# Patient Record
Sex: Male | Born: 1970 | Race: Black or African American | Hispanic: No | Marital: Single | Smoking: Never smoker
Health system: Southern US, Community
[De-identification: ages and names within clinical notes are randomized; demographics above are authoritative.]

## PROBLEM LIST (undated history)

## (undated) DIAGNOSIS — K8020 Calculus of gallbladder without cholecystitis without obstruction: Secondary | ICD-10-CM

## (undated) DIAGNOSIS — I10 Essential (primary) hypertension: Secondary | ICD-10-CM

## (undated) DIAGNOSIS — K219 Gastro-esophageal reflux disease without esophagitis: Secondary | ICD-10-CM

---

## 2009-03-15 ENCOUNTER — Emergency Department: Payer: Self-pay | Admitting: Emergency Medicine

## 2011-06-11 ENCOUNTER — Emergency Department: Payer: Self-pay | Admitting: Emergency Medicine

## 2011-07-30 ENCOUNTER — Emergency Department: Payer: Self-pay | Admitting: Emergency Medicine

## 2014-03-13 ENCOUNTER — Emergency Department: Payer: Self-pay | Admitting: General Practice

## 2014-10-03 ENCOUNTER — Other Ambulatory Visit: Payer: Self-pay

## 2014-10-03 ENCOUNTER — Encounter: Payer: Self-pay | Admitting: Medical Oncology

## 2014-10-03 ENCOUNTER — Emergency Department
Admission: EM | Admit: 2014-10-03 | Discharge: 2014-10-03 | Disposition: A | Discharge status: Home / Self Care | Payer: No Typology Code available for payment source | Attending: Emergency Medicine | Admitting: Emergency Medicine

## 2014-10-03 ENCOUNTER — Emergency Department: Payer: No Typology Code available for payment source

## 2014-10-03 DIAGNOSIS — R1011 Right upper quadrant pain: Secondary | ICD-10-CM

## 2014-10-03 DIAGNOSIS — K8020 Calculus of gallbladder without cholecystitis without obstruction: Secondary | ICD-10-CM | POA: Diagnosis not present

## 2014-10-03 LAB — CBC WITH DIFFERENTIAL/PLATELET
Basophils Absolute: 0 10*3/uL (ref 0–0.1)
Basophils Relative: 0 %
Eosinophils Absolute: 0 10*3/uL (ref 0–0.7)
HCT: 45.5 % (ref 40.0–52.0)
Hemoglobin: 14.5 g/dL (ref 13.0–18.0)
LYMPHS ABS: 2 10*3/uL (ref 1.0–3.6)
Lymphocytes Relative: 15 %
MCH: 26.4 pg (ref 26.0–34.0)
MCHC: 31.8 g/dL — AB (ref 32.0–36.0)
MCV: 83.2 fL (ref 80.0–100.0)
Monocytes Absolute: 0.5 10*3/uL (ref 0.2–1.0)
Neutro Abs: 10.7 10*3/uL — ABNORMAL HIGH (ref 1.4–6.5)
Neutrophils Relative %: 81 %
PLATELETS: 509 10*3/uL — AB (ref 150–440)
RBC: 5.47 MIL/uL (ref 4.40–5.90)
RDW: 16.2 % — AB (ref 11.5–14.5)
WBC: 13.4 10*3/uL — ABNORMAL HIGH (ref 3.8–10.6)

## 2014-10-03 LAB — COMPREHENSIVE METABOLIC PANEL
ALBUMIN: 4.1 g/dL (ref 3.5–5.0)
ALT: 29 U/L (ref 17–63)
ANION GAP: 9 (ref 5–15)
AST: 19 U/L (ref 15–41)
Alkaline Phosphatase: 68 U/L (ref 38–126)
BUN: 17 mg/dL (ref 6–20)
CO2: 27 mmol/L (ref 22–32)
Calcium: 10 mg/dL (ref 8.9–10.3)
Chloride: 104 mmol/L (ref 101–111)
Creatinine, Ser: 1.51 mg/dL — ABNORMAL HIGH (ref 0.61–1.24)
GFR calc Af Amer: >60 mL/min (ref >60)
GFR, EST NON AFRICAN AMERICAN: 55 mL/min — AB (ref >60)
GLUCOSE: 160 mg/dL — AB (ref 65–99)
Potassium: 3.7 mmol/L (ref 3.5–5.1)
SODIUM: 140 mmol/L (ref 135–145)
Total Bilirubin: 0.3 mg/dL (ref 0.3–1.2)
Total Protein: 8.2 g/dL — ABNORMAL HIGH (ref 6.5–8.1)

## 2014-10-03 LAB — LIPASE, BLOOD: LIPASE: 24 U/L (ref 22–51)

## 2014-10-03 MED ORDER — HYDROMORPHONE HCL 1 MG/ML IJ SOLN
1.0000 mg | INTRAMUSCULAR | Status: AC
  Administered 2014-10-03: 1 mg via INTRAVENOUS

## 2014-10-03 MED ORDER — ONDANSETRON HCL 4 MG/2ML IJ SOLN
INTRAMUSCULAR | Status: AC
  Administered 2014-10-03: 4 mg via INTRAVENOUS
  Filled 2014-10-03: qty 2

## 2014-10-03 MED ORDER — ONDANSETRON HCL 4 MG PO TABS: 4.0000 mg | ORAL_TABLET | ORAL | Status: DC | PRN

## 2014-10-03 MED ORDER — HYDROMORPHONE HCL 1 MG/ML IJ SOLN
INTRAMUSCULAR | Status: AC
  Administered 2014-10-03: 1 mg via INTRAVENOUS
  Filled 2014-10-03: qty 1

## 2014-10-03 MED ORDER — OXYCODONE-ACETAMINOPHEN 5-325 MG PO TABS: 1.0000 | ORAL_TABLET | ORAL | Status: AC | PRN

## 2014-10-03 MED ORDER — HYDROMORPHONE HCL 1 MG/ML IJ SOLN
1.0000 mg | Freq: Once | INTRAMUSCULAR | Status: AC
  Administered 2014-10-03: 1 mg via INTRAVENOUS

## 2014-10-03 MED ORDER — SODIUM CHLORIDE 0.9 % IV BOLUS (SEPSIS)
1000.0000 mL | Freq: Once | INTRAVENOUS | Status: AC
  Administered 2014-10-03: 1000 mL via INTRAVENOUS

## 2014-10-03 MED ORDER — ONDANSETRON HCL 4 MG/2ML IJ SOLN
4.0000 mg | Freq: Once | INTRAMUSCULAR | Status: AC
  Administered 2014-10-03: 4 mg via INTRAVENOUS

## 2014-10-03 NOTE — ED Provider Notes (Signed)
[facility] Emergency Department Provider Note    ____________________________________________  Time seen: 8:40 AM  I have reviewed the triage vital signs and the nursing notes.   HISTORY  Chief Complaint Abdominal Pain      HPI Jay Mccormick is a 44 y.o. male who is in his usual state of health prior to today. This morning he woke up at 4:00 AM, and had used the bathroom. He had 2 loose bowel movements. He also reported some mild right upper quadrant pain at that time. He went back to sleep, and woke up at 6 AM with constant worsening severe right upper quadrant abdominal pain. The pain is crampy, nonradiating, severe, and has no aggravating or alleviating symptoms. He has been eating normally prior to today, but has not eaten or drank anything today. He denies any medical history and takes no medicines.  History reviewed. No pertinent past medical history.  There are no active problems to display for this patient.   History reviewed. No pertinent past surgical history.  Current Outpatient Rx  Name  Route  Sig  Dispense  Refill  . ondansetron (ZOFRAN) 4 MG tablet   Oral   Take 1 tablet (4 mg total) by mouth every 4 (four) hours as needed for nausea or vomiting.   30 tablet   1   . oxyCODONE-acetaminophen (ROXICET) 5-325 MG per tablet   Oral   Take 1 tablet by mouth every 4 (four) hours as needed for severe pain.   20 tablet   0     Allergies Review of patient's allergies indicates no known allergies.  History reviewed. No pertinent family history.  Social History History  Substance Use Topics  . Smoking status: Never Smoker   . Smokeless tobacco: Not on file  . Alcohol Use: Yes    Review of Systems  Constitutional: Negative for fever. Eyes: Negative for visual changes. ENT: Negative for sore throat. Cardiovascular: Negative for chest pain. Respiratory: Negative for shortness of breath. Gastrointestinal: Abdominal pain, nausea,  diarrhea. No vomiting Genitourinary: Negative for dysuria. Musculoskeletal: Negative for back pain. Skin: Negative for rash. Neurological: Negative for headaches, focal weakness or numbness.   10-point ROS otherwise negative.  ____________________________________________   PHYSICAL EXAM:  VITAL SIGNS: ED Triage Vitals  Enc Vitals Group     BP 10/03/14 0822 148/91 mmHg     Pulse Rate 10/03/14 0822 64     Resp 10/03/14 0822 16     Temp 10/03/14 0822 98 F (36.7 C)     Temp Source 10/03/14 0822 Oral     SpO2 10/03/14 0822 95 %     Weight 10/03/14 0822 290 lb (131.543 kg)     Height 10/03/14 0822 5\' 11"  (1.803 m)     Head Cir --      Peak Flow --      Pain Score 10/03/14 0824 10     Pain Loc --      Pain Edu? --      Excl. in GC? --      Constitutional: Alert and oriented. Well appearing and in no distress. Eyes: Conjunctivae are normal. PERRL. Normal extraocular movements. ENT   Head: Normocephalic and atraumatic.   Nose: No congestion/rhinnorhea.   Mouth/Throat: Mucous membranes are dry.   Neck: No stridor. Hematological/Lymphatic/Immunilogical: No cervical lymphadenopathy. Cardiovascular: Normal rate, regular rhythm. Normal and symmetric distal pulses are present in all extremities. No murmurs, rubs, or gallops. Respiratory: Normal respiratory effort without tachypnea nor retractions. Breath  sounds are clear and equal bilaterally. No wheezes/rales/rhonchi. Gastrointestinal: Soft, with right upper quadrant tenderness. The chest wall is nontender.. No distention. No abdominal bruits. There is no CVA tenderness. No rebound, rigidity, or guarding. Genitourinary: Deferred Musculoskeletal: Nontender with normal range of motion in all extremities. No joint effusions.  No lower extremity tenderness nor edema. Neurologic:  Normal speech and language. No gross focal neurologic deficits are appreciated. Speech is normal. No gait instability. Skin:  Skin is warm, dry  and intact. No rash noted. Psychiatric: Mood and affect are normal. Speech and behavior are normal. Patient exhibits appropriate insight and judgment.  ____________________________________________   EKG  Normal sinus rhythm with a rate of 62. There are inferior T wave inversions in 3 and aVF. There is a nonspecific interventricular conduction delay. Normal axis, intervals.  ____________________________________________    RADIOLOGY  RUQ [place] significant significant for a gallstone impacted in the gallbladder neck. There is no pericholecystic fluid, gallbladder wall thickening, or other signs of cholecystitis.  ____________________________________________   PROCEDURES  Procedure(s) performed: None  Critical Care performed: No  ____________________________________________   INITIAL IMPRESSION / ASSESSMENT AND PLAN / ED COURSE  Pertinent labs & imaging results that were available during my care of the patient were reviewed by me and considered in my medical decision making (see chart for details).  The patient's presentation is concerning for cholecystitis. He is afebrile and has normal vital signs, so we will give him IV fluids, IV Dilaudid and Zofran, check labs and a right upper quadrant ultrasound. No suspicion of aortic aneurysm, pneumothorax, ACS, PE, pneumonia, sepsis, appendicitis.  Results for orders placed or performed during the hospital encounter of 10/03/14  Comprehensive metabolic panel  Result Value Ref Range   Sodium 140 135 - 145 mmol/L   Potassium 3.7 3.5 - 5.1 mmol/L   Chloride 104 101 - 111 mmol/L   CO2 27 22 - 32 mmol/L   Glucose, Bld 160 (H) 65 - 99 mg/dL   BUN 17 6 - 20 mg/dL   Creatinine, Ser 1.61 (H) 0.61 - 1.24 mg/dL   Calcium 09.6 8.9 - 04.5 mg/dL   Total Protein 8.2 (H) 6.5 - 8.1 g/dL   Albumin 4.1 3.5 - 5.0 g/dL   AST 19 15 - 41 U/L   ALT 29 17 - 63 U/L   Alkaline Phosphatase 68 38 - 126 U/L   Total Bilirubin 0.3 0.3 - 1.2 mg/dL   GFR calc  non Af Amer 55 (L) >60 mL/min   GFR calc Af Amer >60 >60 mL/min   Anion gap 9 5 - 15  Lipase, blood  Result Value Ref Range   Lipase 24 22 - 51 U/L  CBC with Differential  Result Value Ref Range   WBC 13.4 (H) 3.8 - 10.6 K/uL   RBC 5.47 4.40 - 5.90 MIL/uL   Hemoglobin 14.5 13.0 - 18.0 g/dL   HCT 40.9 81.1 - 91.4 %   MCV 83.2 80.0 - 100.0 fL   MCH 26.4 26.0 - 34.0 pg   MCHC 31.8 (L) 32.0 - 36.0 g/dL   RDW 78.2 (H) 95.6 - 21.3 %   Platelets 509 (H) 150 - 440 K/uL   Neutrophils Relative % 81% %   Neutro Abs 10.7 (H) 1.4 - 6.5 K/uL   Lymphocytes Relative 15% %   Lymphs Abs 2.0 1.0 - 3.6 K/uL   Monocytes Relative 4% %   Monocytes Absolute 0.5 0.2 - 1.0 K/uL   Eosinophils Relative 0% %  Eosinophils Absolute 0.0 0 - 0.7 K/uL   Basophils Relative 0% %   Basophils Absolute 0.0 0 - 0.1 K/uL   ----------------------------------------- 2:14 PM on 10/03/2014 -----------------------------------------  The patient's ultrasound was reviewed at 11:20 AM. It does show an impacted stone in gallbladder neck, and I discussed this with Dr. Anda Kraft at 11:30 AM. Without evidence of biliary obstruction or cholecystitis, was recommended that the patient follow-up in clinic this week if he is able to tolerate by mouth. Patient has been able to take fluids and his pain is controlled at this time. He is afebrile vital signs are within normal limits are otherwise unremarkable, as we will discharge him home with prescriptions for Percocet and Zofran, and he'll follow up with surgery in clinic this week. ____________________________________________   FINAL CLINICAL IMPRESSION(S) / ED DIAGNOSES  Final diagnoses:  Calculus of gallbladder without cholecystitis without obstruction     Sharman Cheek, MD 10/03/14 [phone]

## 2014-10-03 NOTE — ED Notes (Signed)
Pt reports RUQ abd pain that began this am, denies n/v. Reports feeling "sluggish".

## 2014-10-03 NOTE — Discharge Instructions (Signed)
Abdominal Pain  Many things can cause abdominal pain. Usually, abdominal pain is not caused by a disease and will improve without treatment. It can often be observed and treated at home. Your health care provider will do a physical exam and possibly order blood tests and X-rays to help determine the seriousness of your pain. However, in many cases, more time must pass before a clear cause of the pain can be found. Before that point, your health care provider may not know if you need more testing or further treatment.  HOME CARE INSTRUCTIONS   Monitor your abdominal pain for any changes. The following actions may help to alleviate any discomfort you are experiencing:   Only take over-the-counter or prescription medicines as directed by your health care provider.   Do not take laxatives unless directed to do so by your health care provider.   Try a clear liquid diet (broth, tea, or water) as directed by your health care provider. Slowly move to a bland diet as tolerated.  SEEK MEDICAL CARE IF:   You have unexplained abdominal pain.   You have abdominal pain associated with nausea or diarrhea.   You have pain when you urinate or have a bowel movement.   You experience abdominal pain that wakes you in the night.   You have abdominal pain that is worsened or improved by eating food.   You have abdominal pain that is worsened with eating fatty foods.   You have a fever.  SEEK IMMEDIATE MEDICAL CARE IF:    Your pain does not go away within 2 hours.   You keep throwing up (vomiting).   Your pain is felt only in portions of the abdomen, such as the right side or the left lower portion of the abdomen.   You pass bloody or black tarry stools.  MAKE SURE YOU:   Understand these instructions.    Will watch your condition.    Will get help right away if you are not doing well or get worse.   Document Released: 02/27/2005 Document Revised: 05/25/2013 Document Reviewed: 01/27/2013  ExitCare Patient Information  2015 [employer]. This information is not intended to replace advice given to you by your health care provider. Make sure you discuss any questions you have with your health care provider.          Biliary Colic   Biliary colic is a steady or irregular pain in the upper abdomen. It is usually under the right side of the rib cage. It happens when gallstones interfere with the normal flow of bile from the gallbladder. Bile is a liquid that helps to digest fats. Bile is made in the liver and stored in the gallbladder. When you eat a meal, bile passes from the gallbladder through the cystic duct and the common bile duct into the small intestine. There, it mixes with partially digested food. If a gallstone blocks either of these ducts, the normal flow of bile is blocked. The muscle cells in the bile duct contract forcefully to try to move the stone. This causes the pain of biliary colic.   SYMPTOMS    A person with biliary colic usually complains of pain in the upper abdomen. This pain can be:   In the center of the upper abdomen just below the breastbone.   In the upper-right part of the abdomen, near the gallbladder and liver.   Spread back toward the right shoulder blade.   Nausea and vomiting.   The pain   usually occurs after eating.   Biliary colic is usually triggered by the digestive system's demand for bile. The demand for bile is high after fatty meals. Symptoms can also occur when a person who has been fasting suddenly eats a very large meal. Most episodes of biliary colic pass after 1 to 5 hours. After the most intense pain passes, your abdomen may continue to ache mildly for about 24 hours.  DIAGNOSIS   After you describe your symptoms, your caregiver will perform a physical exam. He or she will pay attention to the upper right portion of your belly (abdomen). This is the area of your liver and gallbladder. An ultrasound will help your caregiver look for gallstones. Specialized scans of the  gallbladder may also be done. Blood tests may be done, especially if you have fever or if your pain persists.  PREVENTION   Biliary colic can be prevented by controlling the risk factors for gallstones. Some of these risk factors, such as heredity, increasing age, and pregnancy are a normal part of life. Obesity and a high-fat diet are risk factors you can change through a healthy lifestyle. Women going through menopause who take hormone replacement therapy (estrogen) are also more likely to develop biliary colic.  TREATMENT    Pain medication may be prescribed.   You may be encouraged to eat a fat-free diet.   If the first episode of biliary colic is severe, or episodes of colic keep retuning, surgery to remove the gallbladder (cholecystectomy) is usually recommended. This procedure can be done through small incisions using an instrument called a laparoscope. The procedure often requires a brief stay in the hospital. Some people can leave the hospital the same day. It is the most widely used treatment in people troubled by painful gallstones. It is effective and safe, with no complications in more than 90% of cases.   If surgery cannot be done, medication that dissolves gallstones may be used. This medication is expensive and can take months or years to work. Only small stones will dissolve.   Rarely, medication to dissolve gallstones is combined with a procedure called shock-wave lithotripsy. This procedure uses carefully aimed shock waves to break up gallstones. In many people treated with this procedure, gallstones form again within a few years.  PROGNOSIS   If gallstones block your cystic duct or common bile duct, you are at risk for repeated episodes of biliary colic. There is also a 25% chance that you will develop a gallbladder infection(acute cholecystitis), or some other complication of gallstones within 10 to 20 years. If you have surgery, schedule it at a time that is convenient for you and at a  time when you are not sick.  HOME CARE INSTRUCTIONS    Drink plenty of clear fluids.   Avoid fatty, greasy or fried foods, or any foods that make your pain worse.   Take medications as directed.  SEEK MEDICAL CARE IF:    You develop a fever over 100.5 F (38.1 C).   Your pain gets worse over time.   You develop nausea that prevents you from eating and drinking.   You develop vomiting.  SEEK IMMEDIATE MEDICAL CARE IF:    You have continuous or severe belly (abdominal) pain which is not relieved with medications.   You develop nausea and vomiting which is not relieved with medications.   You have symptoms of biliary colic and you suddenly develop a fever and shaking chills. This may signal cholecystitis. Call your caregiver   immediately.   You develop a yellow color to your skin or the white part of your eyes (jaundice).  Document Released: 10/21/2005 Document Revised: 08/12/2011 Document Reviewed: 12/31/2007  ExitCare Patient Information 2015 [employer]. This information is not intended to replace advice given to you by your health care provider. Make sure you discuss any questions you have with your health care provider.

## 2015-04-12 ENCOUNTER — Emergency Department: Payer: No Typology Code available for payment source

## 2015-04-12 ENCOUNTER — Encounter: Payer: Self-pay | Admitting: Emergency Medicine

## 2015-04-12 ENCOUNTER — Emergency Department
Admission: EM | Admit: 2015-04-12 | Discharge: 2015-04-12 | Disposition: A | Discharge status: Home / Self Care | Payer: No Typology Code available for payment source | Attending: Emergency Medicine | Admitting: Emergency Medicine

## 2015-04-12 DIAGNOSIS — R1110 Vomiting, unspecified: Secondary | ICD-10-CM | POA: Diagnosis not present

## 2015-04-12 DIAGNOSIS — R42 Dizziness and giddiness: Secondary | ICD-10-CM | POA: Diagnosis not present

## 2015-04-12 HISTORY — DX: Calculus of gallbladder without cholecystitis without obstruction: K80.20

## 2015-04-12 LAB — CBC
HCT: 43.3 % (ref 40.0–52.0)
Hemoglobin: 14.1 g/dL (ref 13.0–18.0)
MCH: 26.9 pg (ref 26.0–34.0)
MCHC: 32.6 g/dL (ref 32.0–36.0)
MCV: 82.4 fL (ref 80.0–100.0)
Platelets: 389 10*3/uL (ref 150–440)
RBC: 5.25 MIL/uL (ref 4.40–5.90)
RDW: 16.2 % — AB (ref 11.5–14.5)
WBC: 12.5 10*3/uL — AB (ref 3.8–10.6)

## 2015-04-12 LAB — BASIC METABOLIC PANEL
Anion gap: 11 (ref 5–15)
BUN: 16 mg/dL (ref 6–20)
CHLORIDE: 105 mmol/L (ref 101–111)
CO2: 24 mmol/L (ref 22–32)
Calcium: 9.8 mg/dL (ref 8.9–10.3)
Creatinine, Ser: 1.13 mg/dL (ref 0.61–1.24)
Glucose, Bld: 156 mg/dL — ABNORMAL HIGH (ref 65–99)
POTASSIUM: 3.4 mmol/L — AB (ref 3.5–5.1)
SODIUM: 140 mmol/L (ref 135–145)

## 2015-04-12 MED ORDER — SODIUM CHLORIDE 0.9 % IV BOLUS (SEPSIS)
1000.0000 mL | Freq: Once | INTRAVENOUS | Status: AC
  Administered 2015-04-12: 1000 mL via INTRAVENOUS

## 2015-04-12 MED ORDER — ONDANSETRON HCL 4 MG/2ML IJ SOLN
INTRAMUSCULAR | Status: AC
  Administered 2015-04-12: 4 mg via INTRAVENOUS
  Filled 2015-04-12: qty 2

## 2015-04-12 MED ORDER — ONDANSETRON 4 MG PO TBDP: 4.0000 mg | ORAL_TABLET | Freq: Once | ORAL | Status: DC | PRN

## 2015-04-12 MED ORDER — MECLIZINE HCL 25 MG PO TABS: 25.0000 mg | ORAL_TABLET | Freq: Three times a day (TID) | ORAL | Status: DC | PRN

## 2015-04-12 MED ORDER — MECLIZINE HCL 25 MG PO TABS
25.0000 mg | ORAL_TABLET | Freq: Once | ORAL | Status: AC
  Administered 2015-04-12: 25 mg via ORAL
  Filled 2015-04-12: qty 1

## 2015-04-12 MED ORDER — ONDANSETRON HCL 4 MG/2ML IJ SOLN
4.0000 mg | Freq: Once | INTRAMUSCULAR | Status: AC | PRN
  Administered 2015-04-12: 4 mg via INTRAVENOUS

## 2015-04-12 NOTE — ED Notes (Addendum)
Patient transported to CT via stretcher.

## 2015-04-12 NOTE — Discharge Instructions (Signed)
You have vertigo.   Rest for 2 days.  Take meclizine as needed.   See your doctor.   Return to ER if you have worse dizziness, vomiting, trouble walking, neck pain, blurry vison.    Dizziness Dizziness is a common problem. It makes you feel unsteady or lightheaded. You may feel like you are about to pass out (faint). Dizziness can lead to injury if you stumble or fall. Anyone can get dizzy, but dizziness is more common in older adults. This condition can be caused by a number of things, including:  Medicines.  Dehydration.  Illness. HOME CARE Following these instructions may help with your condition: Eating and Drinking  Drink enough fluid to keep your pee (urine) clear or pale yellow. This helps to keep you from getting dehydrated. Try to drink more clear fluids, such as water.  Do not drink alcohol.  Limit how much caffeine you drink or eat if told by your doctor.  Limit how much salt you drink or eat if told by your doctor. Activity  Avoid making quick movements.  When you stand up from sitting in a chair, steady yourself until you feel okay.  In the morning, first sit up on the side of the bed. When you feel okay, stand slowly while you hold onto something. Do this until you know that your balance is fine.  Move your legs often if you need to stand in one place for a long time. Tighten and relax your muscles in your legs while you are standing.  Do not drive or use heavy machinery if you feel dizzy.  Avoid bending down if you feel dizzy. Place items in your home so that they are easy for you to reach without leaning over. Lifestyle  Do not use any tobacco products, including cigarettes, chewing tobacco, or electronic cigarettes. If you need help quitting, ask your doctor.  Try to lower your stress level, such as with yoga or meditation. Talk with your doctor if you need help. General Instructions  Watch your dizziness for any changes.  Take medicines only as  told by your doctor. Talk with your doctor if you think that your dizziness is caused by a medicine that you are taking.  Tell a friend or a family member that you are feeling dizzy. If he or she notices any changes in your behavior, have this person call your doctor.  Keep all follow-up visits as told by your doctor. This is important. GET HELP IF:  Your dizziness does not go away.  Your dizziness or light-headedness gets worse.  You feel sick to your stomach (nauseous).  You have trouble hearing.  You have new symptoms.  You are unsteady on your feet or you feel like the room is spinning. GET HELP RIGHT AWAY IF:  You throw up (vomit) or have diarrhea and are unable to eat or drink anything.  You have trouble:  Talking.  Walking.  Swallowing.  Using your arms, hands, or legs.  You feel generally weak.  You are not thinking clearly or you have trouble forming sentences. It may take a friend or family member to notice this.  You have:  Chest pain.  Pain in your belly (abdomen).  Shortness of breath.  Sweating.  Your vision changes.  You are bleeding.  You have a headache.  You have neck pain or a stiff neck.  You have a fever.   This information is not intended to replace advice given to you by your  health care provider. Make sure you discuss any questions you have with your health care provider.   Document Released: 05/09/2011 Document Revised: 10/04/2014 Document Reviewed: 05/16/2014 Elsevier Interactive Patient Education [employer]2016 [employer]

## 2015-04-12 NOTE — ED Provider Notes (Signed)
CSN: [id number]     Arrival date & time 04/12/15  4098 History   First MD Initiated Contact with Patient 04/12/15 [phone]     Chief Complaint  Patient presents with  . Dizziness     (Consider location/radiation/quality/duration/timing/severity/associated sxs/prior Treatment) The history is provided by the patient.  Jay Mccormick is a 44 y.o. male here with dizziness, vomiting. Patient states that he woke up yesterday morning around 5:30 AM and felt that the room was spinning. States that it is worse when he turns his head. He also feels unsteady but did not fall. Denies any chest pain or shortness of breath. Vomited once in the ER. Denies any history of strokes. Denies any blurry vision or double vision.   Past Medical History  Diagnosis Date  . Gallstones    History reviewed. No pertinent past surgical history. No family history on file. Social History  Substance Use Topics  . Smoking status: Never Smoker   . Smokeless tobacco: None  . Alcohol Use: Yes    Review of Systems  Neurological: Positive for dizziness.  All other systems reviewed and are negative.     Allergies  Review of patient's allergies indicates no known allergies.  Home Medications   Prior to Admission medications   Medication Sig Start Date End Date Taking? Authorizing Provider  ondansetron (ZOFRAN) 4 MG tablet Take 1 tablet (4 mg total) by mouth every 4 (four) hours as needed for nausea or vomiting. 10/03/14 10/03/15  Sharman Cheek, MD   BP 106/71 mmHg  Pulse 76  Temp(Src) 97.9 F (36.6 C) (Oral)  Resp 13  Ht  (1.803 m)  Wt 280 lb (127.007 kg)  BMI 39.07 kg/m2  SpO2 100% Physical Exam  Constitutional: He is oriented to person, place, and time.  Comfortable   HENT:  Head: Normocephalic.  Right Ear: External ear normal.  Left Ear: External ear normal.  Mouth/Throat: Oropharynx is clear and moist.  Eyes:  Mild L horizontal nystagmus, no rotatory nystagmus   Neck: Normal range of motion.  Neck supple.  Cardiovascular: Normal rate, regular rhythm and normal heart sounds.   Pulmonary/Chest: Effort normal and breath sounds normal. No respiratory distress. He has no wheezes.  Abdominal: Soft. Bowel sounds are normal. He exhibits no distension. There is no tenderness.  Musculoskeletal: Normal range of motion. He exhibits no edema.  Neurological: He is alert and oriented to person, place, and time.  CN 2-12 intact. Nl finger to nose. No rhomberg sign. Nl gait.   Skin: Skin is warm and dry.  Nursing note and vitals reviewed.   ED Course  Procedures (including critical care time) Labs Review Labs Reviewed  BASIC METABOLIC PANEL - Abnormal; Notable for the following:    Potassium 3.4 (*)    Glucose, Bld 156 (*)    All other components within normal limits  CBC - Abnormal; Notable for the following:    WBC 12.5 (*)    RDW 16.2 (*)    All other components within normal limits  CBG MONITORING, ED    Imaging Review Ct Head Wo Contrast  04/12/2015  CLINICAL DATA:  Dizziness since yesterday. Dizziness comes and goes. EXAM: CT HEAD WITHOUT CONTRAST TECHNIQUE: Contiguous axial images were obtained from the base of the skull through the vertex without intravenous contrast. COMPARISON:  None. FINDINGS: Skull and Sinuses:Negative for fracture or destructive process. The mastoids, middle ears, and imaged paranasal sinuses are clear. Orbits: Proptotic appearance without retro-orbital mass or extraocular muscle enlargement. Brain:  Normal. No evidence of acute infarction, hemorrhage, hydrocephalus, or mass lesion/mass effect. IMPRESSION: Negative.  No explanation for dizziness. Electronically Signed   By: Marnee SpringJonathon  Watts M.D.   On: 04/12/2015 06:29   I have personally reviewed and evaluated these images and lab results as part of my medical decision-making.   EKG Interpretation None      ED ECG REPORT I, Kieley Akter, the attending physician, personally viewed and interpreted this  ECG.   Date: 04/12/2015  EKG Time: 5:55 am  Rate: 80  Rhythm: normal EKG, normal sinus rhythm, unchanged from previous tracings  Axis: normal  Intervals:none  ST&T Change: none   MDM   Final diagnoses:  None    Cherly AndersonCray Sturgill is a 44 y.o. male here with dizziness. Has L horizontal nystagmus that improved with time. No rotatory nystagmus, nl neuro exam and nl gait. Labs unremarkable. CT head nl. Felt better with meclizine. Likely peripheral vertigo. No other signs of posterior stroke. Will not need MRI currently. Will dc home with prn meclizine,.     Richardean Canalavid H Smita Lesh, MD 04/12/15 [phone]

## 2015-04-12 NOTE — ED Notes (Signed)
Pt states dizziness started yesterday at 5pm. Pt states he also is nauseous. Emesis x1.

## 2015-04-12 NOTE — ED Notes (Signed)
Pt presents to the ER from home with complaints of dizziness "room spinning around" feeling nausea, reports unable to stand on feet because of dizziness. Pt talks in complete sentences denies any other medical complain at present.

## 2015-06-07 ENCOUNTER — Emergency Department
Admission: EM | Admit: 2015-06-07 | Discharge: 2015-06-07 | Disposition: A | Discharge status: Home / Self Care | Payer: No Typology Code available for payment source

## 2015-06-08 ENCOUNTER — Emergency Department
Admission: EM | Admit: 2015-06-08 | Discharge: 2015-06-08 | Disposition: A | Discharge status: Home / Self Care | Payer: BLUE CROSS/BLUE SHIELD | Attending: Emergency Medicine | Admitting: Emergency Medicine

## 2015-06-08 ENCOUNTER — Encounter: Payer: Self-pay | Admitting: *Deleted

## 2015-06-08 DIAGNOSIS — R112 Nausea with vomiting, unspecified: Secondary | ICD-10-CM | POA: Diagnosis present

## 2015-06-08 LAB — BASIC METABOLIC PANEL
ANION GAP: 6 (ref 5–15)
BUN: 13 mg/dL (ref 6–20)
CHLORIDE: 104 mmol/L (ref 101–111)
CO2: 30 mmol/L (ref 22–32)
Calcium: 10 mg/dL (ref 8.9–10.3)
Creatinine, Ser: 1.21 mg/dL (ref 0.61–1.24)
GFR calc Af Amer: >60 mL/min (ref >60)
GLUCOSE: 105 mg/dL — AB (ref 65–99)
POTASSIUM: 4 mmol/L (ref 3.5–5.1)
Sodium: 140 mmol/L (ref 135–145)

## 2015-06-08 LAB — CBC WITH DIFFERENTIAL/PLATELET
BASOS ABS: 0 10*3/uL (ref 0–0.1)
Basophils Relative: 1 %
EOS PCT: 2 %
Eosinophils Absolute: 0.2 10*3/uL (ref 0–0.7)
HEMATOCRIT: 44.9 % (ref 40.0–52.0)
Hemoglobin: 14.8 g/dL (ref 13.0–18.0)
LYMPHS ABS: 2.1 10*3/uL (ref 1.0–3.6)
LYMPHS PCT: 24 %
MCH: 26.7 pg (ref 26.0–34.0)
MCHC: 32.9 g/dL (ref 32.0–36.0)
MCV: 81.2 fL (ref 80.0–100.0)
Monocytes Absolute: 0.5 10*3/uL (ref 0.2–1.0)
Monocytes Relative: 7 %
NEUTROS ABS: 5.6 10*3/uL (ref 1.4–6.5)
Neutrophils Relative %: 66 %
Platelets: 432 10*3/uL (ref 150–440)
RBC: 5.53 MIL/uL (ref 4.40–5.90)
RDW: 15.7 % — AB (ref 11.5–14.5)
WBC: 8.5 10*3/uL (ref 3.8–10.6)

## 2015-06-08 LAB — LIPASE, BLOOD: LIPASE: 18 U/L (ref 11–51)

## 2015-06-08 MED ORDER — ONDANSETRON HCL 4 MG PO TABS: 4.0000 mg | ORAL_TABLET | Freq: Four times a day (QID) | ORAL | Status: DC | PRN

## 2015-06-08 NOTE — ED Provider Notes (Signed)
[facility] Emergency Department Provider Note ____________________________________________  Time seen: 1515  I have reviewed the triage vital signs and the nursing notes.  HISTORY  Chief Complaint  Emesis  HPI Jay Mccormick is a 45 y.o. male presents to the ED for evaluation of resolved sharp stomach pains and one episode of vomiting last night. He denies any vomiting in the last 12-18 hours. He also denies any fevers, chills, sweats. He describes episode of vomiting last night about 10 minutes after he ate a dinner meal which included meat loaf, macaroni and cheese, and sweet potato casserole. He denies similar symptoms in his dinner date. He is currently without nausea, vomiting, or abdominal pain. He notes decreased appetite today. He has had a small bag of Chee-tos today.   Past Medical History  Diagnosis Date  . Gallstones     There are no active problems to display for this patient.   History reviewed. No pertinent past surgical history.  Current Outpatient Rx  Name  Route  Sig  Dispense  Refill  . meclizine (ANTIVERT) 25 MG tablet   Oral   Take 1 tablet (25 mg total) by mouth 3 (three) times daily as needed for dizziness.   20 tablet   0   . ondansetron (ZOFRAN) 4 MG tablet   Oral   Take 1 tablet (4 mg total) by mouth every 6 (six) hours as needed for nausea or vomiting.   15 tablet   0    Allergies Review of patient's allergies indicates no known allergies.  No family history on file.  Social History Social History  Substance Use Topics  . Smoking status: Never Smoker   . Smokeless tobacco: None  . Alcohol Use: Yes   Review of Systems  Constitutional: Negative for fever. Eyes: Negative for visual changes. ENT: Negative for sore throat. Cardiovascular: Negative for chest pain. Respiratory: Negative for shortness of breath. Gastrointestinal: Negative for abdominal pain, vomiting and diarrhea. Genitourinary: Negative for  dysuria. Musculoskeletal: Negative for back pain. Skin: Negative for rash. Neurological: Negative for headaches, focal weakness or numbness. ____________________________________________  PHYSICAL EXAM:  VITAL SIGNS: ED Triage Vitals  Enc Vitals Group     BP 06/08/15 1359 154/98 mmHg     Pulse Rate 06/08/15 1359 90     Resp 06/08/15 1359 20     Temp 06/08/15 1359 98.4 F (36.9 C)     Temp Source 06/08/15 1359 Oral     SpO2 06/08/15 1359 98 %     Weight 06/08/15 1359 280 lb (127.007 kg)     Height 06/08/15 1359 5\' 11"  (1.803 m)     Head Cir --      Peak Flow --      Pain Score --      Pain Loc --      Pain Edu? --      Excl. in GC? --    Constitutional: Alert and oriented. Well appearing and in no distress. Head: Normocephalic and atraumatic.      Eyes: Conjunctivae are normal. PERRL. Normal extraocular movements      Ears: Canals clear. TMs intact bilaterally.   Nose: No congestion/rhinorrhea.   Mouth/Throat: Mucous membranes are moist.   Neck: Supple. No thyromegaly. Hematological/Lymphatic/Immunological: No cervical lymphadenopathy. Cardiovascular: Normal rate, regular rhythm.  Respiratory: Normal respiratory effort. No wheezes/rales/rhonchi. Gastrointestinal: Soft and nontender. No distention, rebound, guarding, or organomegaly.  Musculoskeletal: Nontender with normal range of motion in all extremities.  Neurologic:  Normal gait without  ataxia. Normal speech and language. No gross focal neurologic deficits are appreciated. Skin:  Skin is warm, dry and intact. No rash noted. Psychiatric: Mood and affect are normal. Patient exhibits appropriate insight and judgment. ____________________________________________    LABS (pertinent positives/negatives) Labs Reviewed  BASIC METABOLIC PANEL - Abnormal; Notable for the following:    Glucose, Bld 105 (*)    All other components within normal limits  CBC WITH DIFFERENTIAL/PLATELET - Abnormal; Notable for the  following:    RDW 15.7 (*)    All other components within normal limits  LIPASE, BLOOD  ____________________________________________  PROCEDURES  Zofran 4 mg ODT ____________________________________________  INITIAL IMPRESSION / ASSESSMENT AND PLAN / ED COURSE  Reassurance to the patient following normal labs and exam. He is discharged with a prescription for Zofran to dose as needed. He is encouraged to follow-up with Dr. Juliann Pulse for surgical consultation for known of gall stones. Return as needed for worsening symptoms.  ____________________________________________  FINAL CLINICAL IMPRESSION(S) / ED DIAGNOSES  Final diagnoses:  Nausea and vomiting in adult patient      Lissa Hoard, PA-C 06/08/15 1708  Sharyn Creamer, MD 06/08/15 2214

## 2015-06-08 NOTE — ED Notes (Signed)
Pt reports vomiting once after dinner last night, pt denies any other symptoms

## 2015-06-08 NOTE — ED Notes (Signed)
States he had some sharp stomach and 1 episode of vomiting last pm  ..has not had any vomiting since sipping on g'ale on arrival   No fever or diarrhea

## 2015-06-08 NOTE — ED Notes (Signed)
NAD noted at time of D/C. Pt denies questions or concerns. Pt ambulatory to the lobby at this time.  

## 2015-06-08 NOTE — Discharge Instructions (Signed)
Nausea and Vomiting Nausea means you feel sick to your stomach. Throwing up (vomiting) is a reflex where stomach contents come out of your mouth. HOME CARE   Take medicine as told by your doctor.  Do not force yourself to eat. However, you do need to drink fluids.  If you feel like eating, eat a normal diet as told by your doctor.  Eat rice, wheat, potatoes, bread, lean meats, yogurt, fruits, and vegetables.  Avoid high-fat foods.  Drink enough fluids to keep your pee (urine) clear or pale yellow.  Ask your doctor how to replace body fluid losses (rehydrate). Signs of body fluid loss (dehydration) include:  Feeling very thirsty.  Dry lips and mouth.  Feeling dizzy.  Dark pee.  Peeing less than normal.  Feeling confused.  Fast breathing or heart rate. GET HELP RIGHT AWAY IF:   You have blood in your throw up.  You have black or bloody poop (stool).  You have a bad headache or stiff neck.  You feel confused.  You have bad belly (abdominal) pain.  You have chest pain or trouble breathing.  You do not pee at least once every 8 hours.  You have cold, clammy skin.  You keep throwing up after 24 to 48 hours.  You have a fever. MAKE SURE YOU:   Understand these instructions.  Will watch your condition.  Will get help right away if you are not doing well or get worse.   This information is not intended to replace advice given to you by your health care provider. Make sure you discuss any questions you have with your health care provider.   Document Released: 11/06/2007 Document Revised: 08/12/2011 Document Reviewed: 10/19/2010 Elsevier Interactive Patient Education [employer]2016 [employer] Inc.  Your labs and exam were normal today. Continue to monitor symptoms. Follow-up with Dr. Juliann PulseLundquist for surgical consultation. Take the nausea medicine as needed.

## 2016-05-12 IMAGING — CT CT HEAD W/O CM
1 series · 16 of 30 positions shown, 20 images · non-contrast
Comparison: None.

CLINICAL DATA: Dizziness since yesterday. Dizziness comes and goes.

EXAM:
CT HEAD WITHOUT CONTRAST
TECHNIQUE: Contiguous axial images were obtained from the base of the skull
through the vertex without intravenous contrast.

[Series 2: head wo · axial · 0.45mm/px · z∈[-200,-56]mm · 16 of 36 slices shown, 20 images]
[im 2/36  brain]
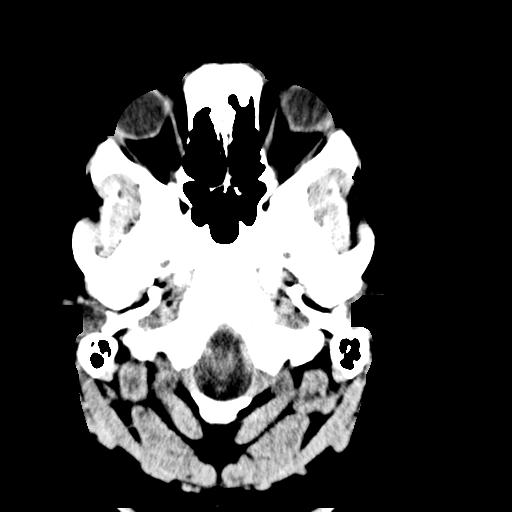
[im 2/36  bone]
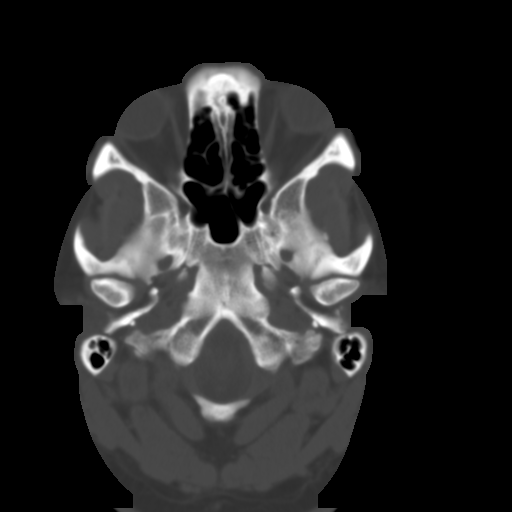
[im 4/36  brain]
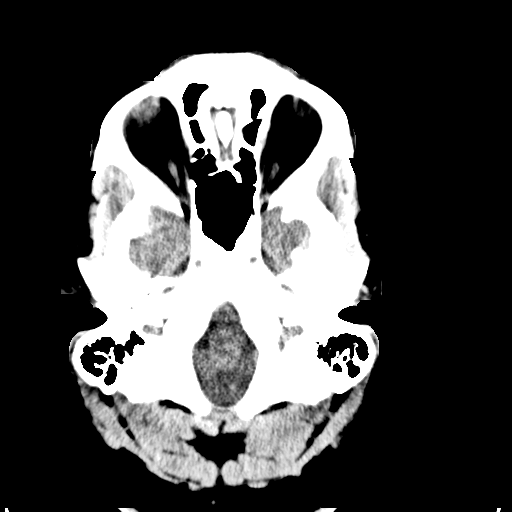
[im 7/36  brain]
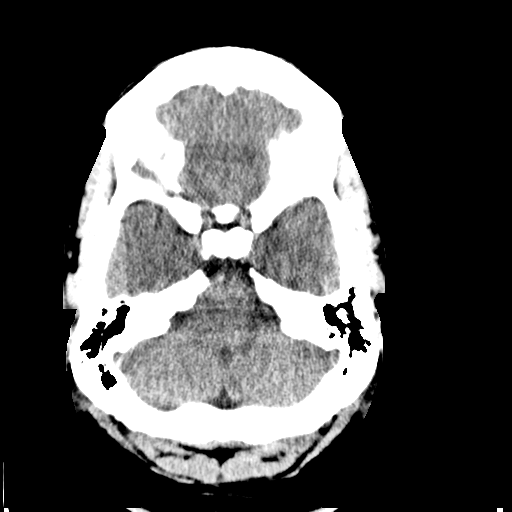
[im 9/36  brain]
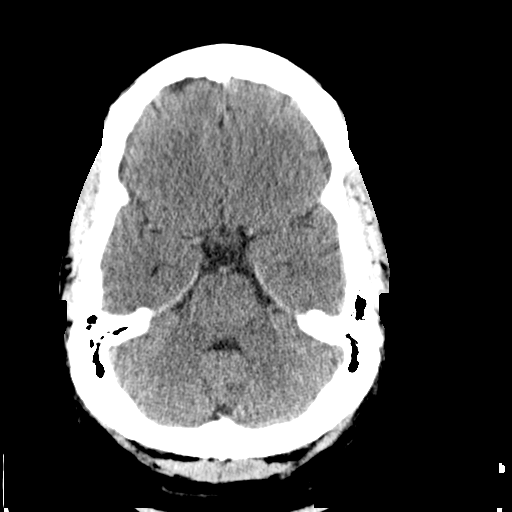
[im 10/36  brain]
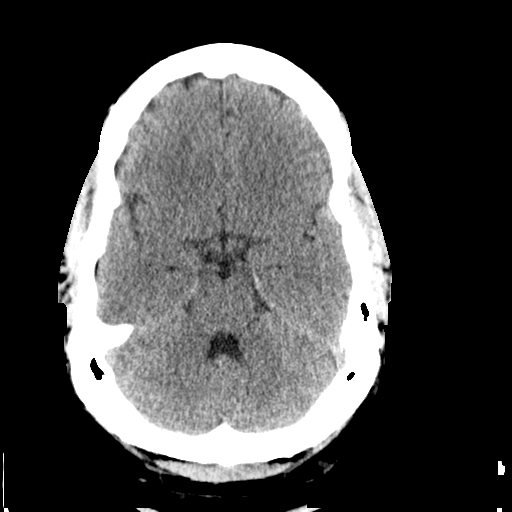
[im 10/36  bone]
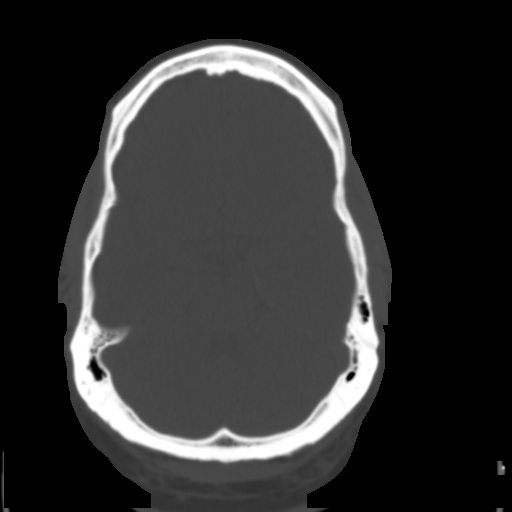
[im 13/36  brain]
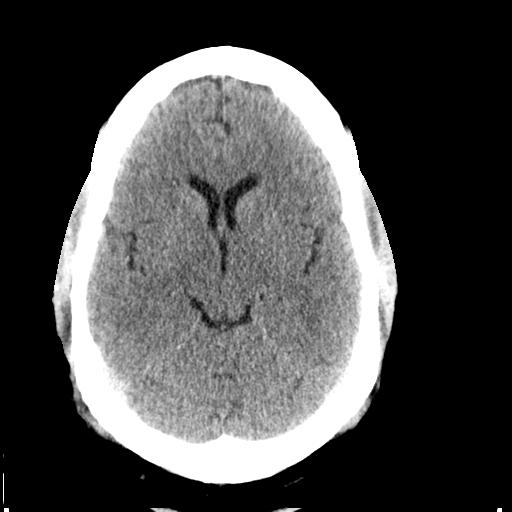
[im 15/36  brain]
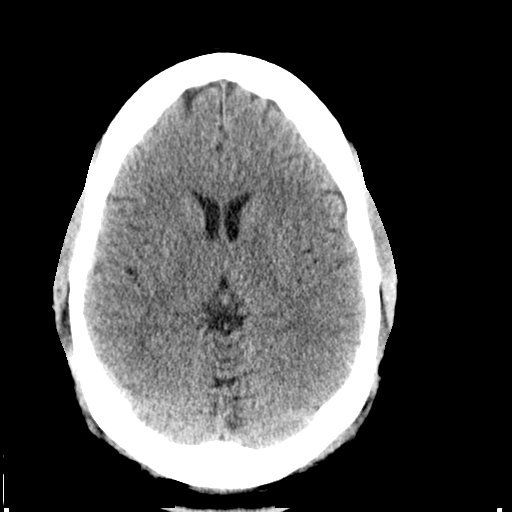
[im 17/36  brain]
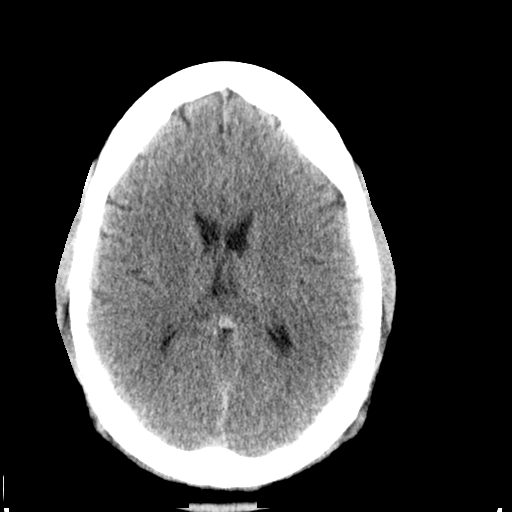
[im 19/36  brain]
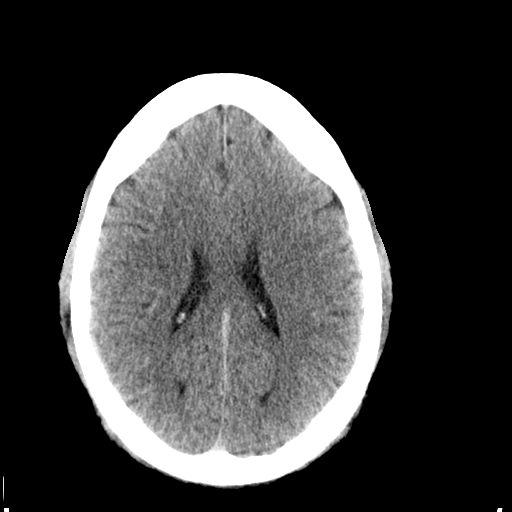
[im 19/36  bone]
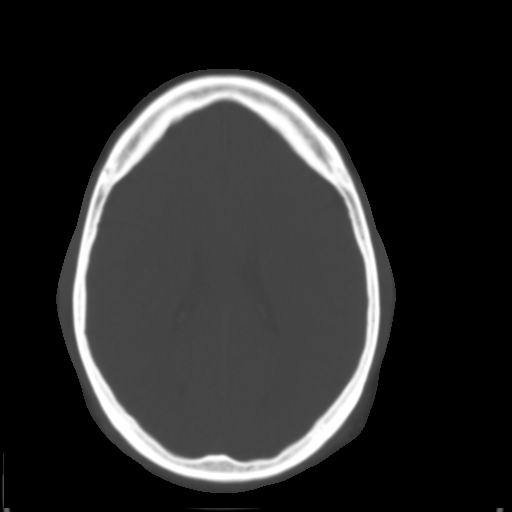
[im 21/36  brain]
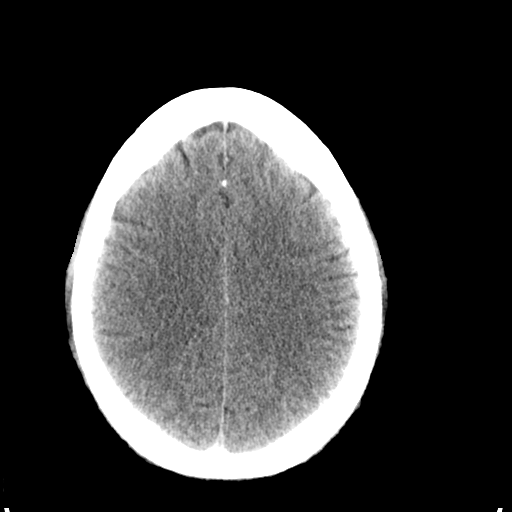
[im 23/36  brain]
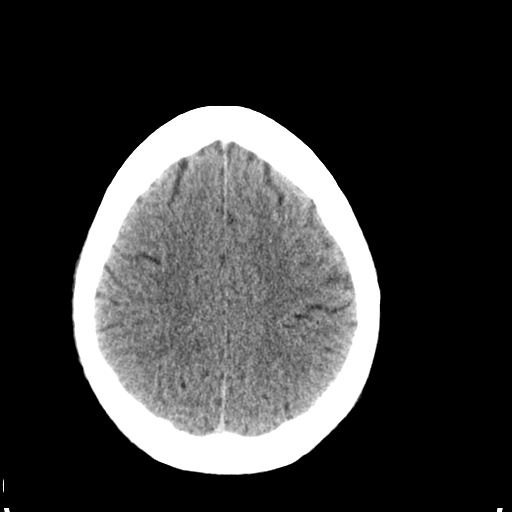
[im 26/36  brain]
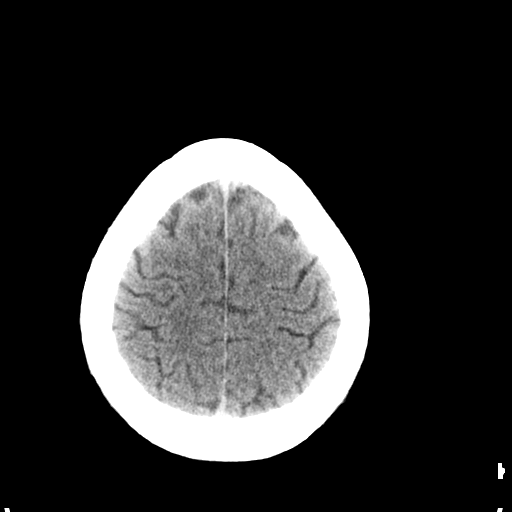
[im 27/36  brain]
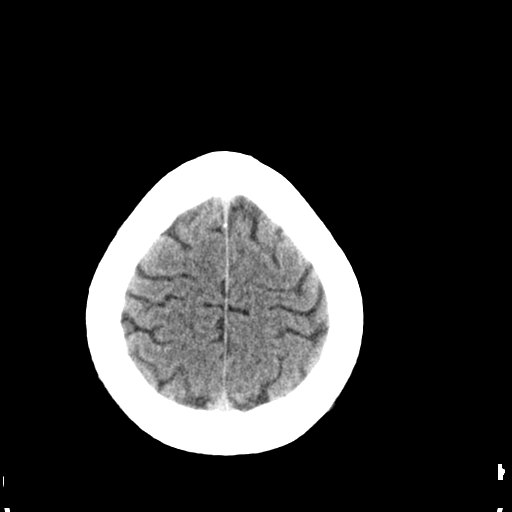
[im 27/36  bone]
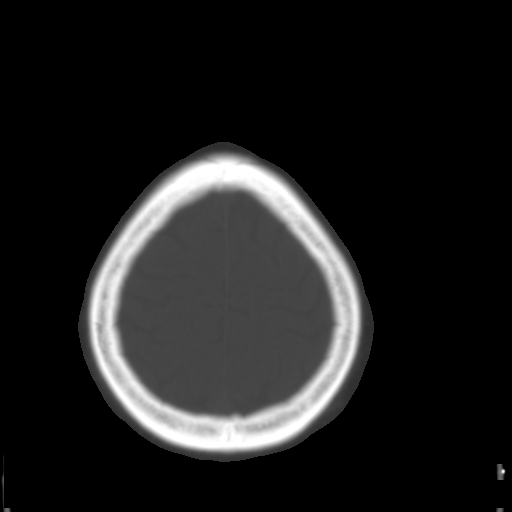
[im 29/36  brain]
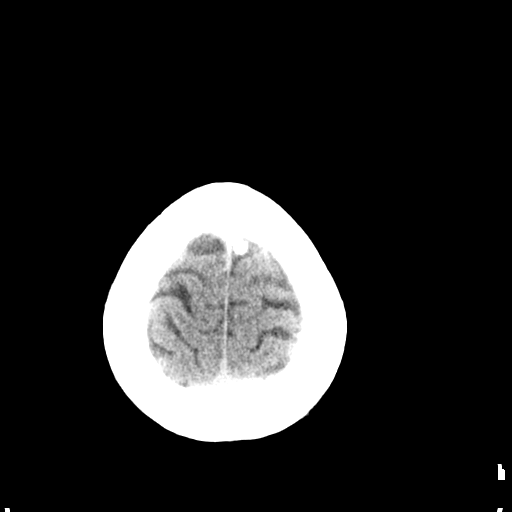
[im 32/36  brain]
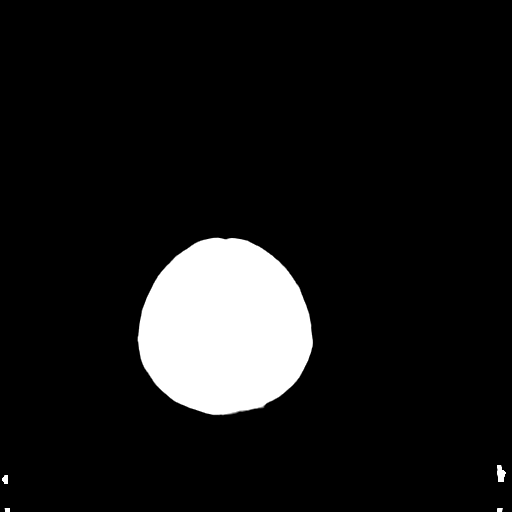
[im 34/36  brain]
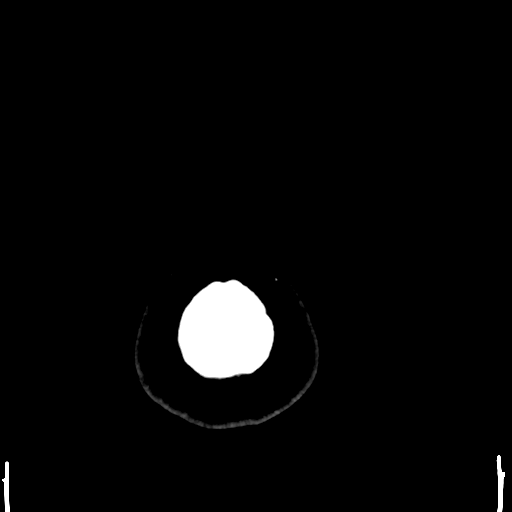

[16 of 30 positions shown; findings below may reference images not displayed]

FINDINGS: Skull and Sinuses:Negative for fracture or destructive process. The
mastoids, middle ears, and imaged paranasal sinuses are clear.

Orbits: Proptotic appearance without retro-orbital mass or
extraocular muscle enlargement.

Brain: Normal. No evidence of acute infarction, hemorrhage,
hydrocephalus, or mass lesion/mass effect.
IMPRESSION: Negative.  No explanation for dizziness.

## 2016-06-26 ENCOUNTER — Encounter: Payer: Self-pay | Admitting: Emergency Medicine

## 2016-06-26 ENCOUNTER — Emergency Department
Admission: EM | Admit: 2016-06-26 | Discharge: 2016-06-26 | Disposition: A | Discharge status: Home / Self Care | Payer: BLUE CROSS/BLUE SHIELD | Attending: Emergency Medicine | Admitting: Emergency Medicine

## 2016-06-26 ENCOUNTER — Emergency Department: Payer: BLUE CROSS/BLUE SHIELD

## 2016-06-26 DIAGNOSIS — M85352 Osteitis condensans, left thigh: Secondary | ICD-10-CM | POA: Diagnosis not present

## 2016-06-26 DIAGNOSIS — M79609 Pain in unspecified limb: Secondary | ICD-10-CM

## 2016-06-26 DIAGNOSIS — M79605 Pain in left leg: Secondary | ICD-10-CM | POA: Diagnosis present

## 2016-06-26 DIAGNOSIS — M8538 Osteitis condensans, other site: Secondary | ICD-10-CM

## 2016-06-26 DIAGNOSIS — M792 Neuralgia and neuritis, unspecified: Secondary | ICD-10-CM

## 2016-06-26 MED ORDER — TIZANIDINE HCL 4 MG PO TABS: 4.0000 mg | ORAL_TABLET | Freq: Three times a day (TID) | ORAL | 0 refills | Status: DC

## 2016-06-26 MED ORDER — GABAPENTIN 400 MG PO CAPS
400.0000 mg | ORAL_CAPSULE | Freq: Three times a day (TID) | ORAL | 2 refills | Status: AC
Start: 2016-06-26 — End: 2018-06-16

## 2016-06-26 MED ORDER — MELOXICAM 15 MG PO TABS: 15.0000 mg | ORAL_TABLET | Freq: Every day | ORAL | 0 refills | Status: AC

## 2016-06-26 NOTE — ED Provider Notes (Signed)
[facility] Emergency Department Provider Note ____________________________________________  Time seen: Approximately 12:06 PM  I have reviewed the triage vital signs and the nursing notes.   HISTORY  Chief Complaint Leg Pain    HPI Jay Mccormick is a 46 y.o. male who presents to the emergency department for left/hip/ leg/thigh pain. Pain is sharp, shooting, and intermittent. He has taken tylenol and percocet without relief. No known injury.  Past Medical History:  Diagnosis Date  . Gallstones     There are no active problems to display for this patient.   History reviewed. No pertinent surgical history.  Prior to Admission medications   Medication Sig Start Date End Date Taking? Authorizing Provider  gabapentin (NEURONTIN) 400 MG capsule Take 1 capsule (400 mg total) by mouth 3 (three) times daily. 06/26/16 06/26/17  Chinita Pester, FNP  meloxicam (MOBIC) 15 MG tablet Take 1 tablet (15 mg total) by mouth daily. 06/26/16   Chinita Pester, FNP  tiZANidine (ZANAFLEX) 4 MG tablet Take 1 tablet (4 mg total) by mouth 3 (three) times daily. 06/26/16   Chinita Pester, FNP    Allergies Patient has no known allergies.  No family history on file.  Social History Social History  Substance Use Topics  . Smoking status: Never Smoker  . Smokeless tobacco: Never Used  . Alcohol use Yes    Review of Systems Constitutional: No recent illness. Cardiovascular: Denies chest pain or palpitations. Respiratory: Denies shortness of breath. Musculoskeletal: Pain in Left upper thigh, both anterior and posterior aspects intermittently Skin: Negative for rash, wound, lesion. Neurological: Negative for focal weakness or numbness.  ____________________________________________   PHYSICAL EXAM:  VITAL SIGNS: ED Triage Vitals  Enc Vitals Group     BP 06/26/16 1111 (!) 137/99     Pulse Rate 06/26/16 1111 (!) 110     Resp 06/26/16 1111 18     Temp 06/26/16 1111  98.2 F (36.8 C)     Temp Source 06/26/16 1111 Oral     SpO2 06/26/16 1111 96 %     Weight 06/26/16 1112 290 lb (131.5 kg)     Height 06/26/16 1112 5\' 11"  (1.803 m)     Head Circumference --      Peak Flow --      Pain Score 06/26/16 1112 6     Pain Loc --      Pain Edu? --      Excl. in GC? --     Constitutional: Alert and oriented. Well appearing and in no acute distress. Eyes: Conjunctivae are normal. EOMI. Head: Atraumatic. Neck: No stridor.  Respiratory: Normal respiratory effort.   Musculoskeletal: Full range of motion of the left hip and knee. Pain does not increase with internal rotation of the thigh. Neurologic:  Normal speech and language. No gross focal neurologic deficits are appreciated. Speech is normal. No gait instability. Skin:  Skin is warm, dry and intact. Atraumatic. Psychiatric: Mood and affect are normal. Speech and behavior are normal.  ____________________________________________   LABS (all labs ordered are listed, but only abnormal results are displayed)  Labs Reviewed - No data to display ____________________________________________  RADIOLOGY  Left hip is negative for acute abnormality per radiology ____________________________________________   PROCEDURES  Procedure(s) performed: None   ____________________________________________   INITIAL IMPRESSION / ASSESSMENT AND PLAN / ED COURSE     Pertinent labs & imaging results that were available during my care of the patient were reviewed by me and considered in  my medical decision making (see chart for details).  46 year old male presenting to the emergency department for left thigh pain unrelated to traumatic incident. No acute findings noted on plain film of the left hip. Today he'll be treated with gabapentin, meloxicam, and Zanaflex. He was encouraged to follow up with orthopedics or his primary care provider for symptoms that are not improving with medications and time. He was  instructed to return to the emergency department for symptoms that change or worsen if he is unable schedule an appointment. ____________________________________________   FINAL CLINICAL IMPRESSION(S) / ED DIAGNOSES  Final diagnoses:  Acute pain of extremity  Neuralgia of left thigh  Osteitis condensans ilii       Chinita PesterCari B Aminata [place], FNP 06/26/16 1555    Arnaldo NatalPaul F Malinda, MD 06/26/16 1701

## 2016-06-26 NOTE — ED Triage Notes (Signed)
Pt comes into the ED via POV c/o left upper leg pain that shoots into his buttock.  Patient denies any injury to cause the pain, denies any swelling, or wounds.  Patient ambulatory to the triage room with no difficulty.  Patient states he has been here before for the same reason and there was nothing they could find that was causing the problem other than muscular pain.  Patient in NAD at this time.

## 2016-06-26 NOTE — ED Notes (Signed)
See triage note   States he is having pain from left knee which radiates up into left thigh  States pain started last Tuesday w/o injury  Ambulates well to treatment room

## 2016-09-08 ENCOUNTER — Emergency Department
Admission: EM | Admit: 2016-09-08 | Discharge: 2016-09-09 | Disposition: A | Discharge status: Home / Self Care | Payer: BLUE CROSS/BLUE SHIELD | Attending: Emergency Medicine | Admitting: Emergency Medicine

## 2016-09-08 ENCOUNTER — Encounter: Payer: Self-pay | Admitting: Emergency Medicine

## 2016-09-08 DIAGNOSIS — Z5321 Procedure and treatment not carried out due to patient leaving prior to being seen by health care provider: Secondary | ICD-10-CM | POA: Diagnosis not present

## 2016-09-08 DIAGNOSIS — I10 Essential (primary) hypertension: Secondary | ICD-10-CM | POA: Insufficient documentation

## 2016-09-08 NOTE — ED Triage Notes (Signed)
Pt presents to ED c/o elevated BP at home of 138/100. Pt does not normally take medication, but was concerned because the diastolic was high.

## 2018-06-16 ENCOUNTER — Other Ambulatory Visit: Payer: Self-pay

## 2018-06-16 ENCOUNTER — Encounter: Payer: Self-pay | Admitting: Emergency Medicine

## 2018-06-16 ENCOUNTER — Ambulatory Visit
Admission: EM | Admit: 2018-06-16 | Discharge: 2018-06-16 | Disposition: A | Discharge status: Home / Self Care | Payer: BLUE CROSS/BLUE SHIELD | Attending: Family Medicine | Admitting: Family Medicine

## 2018-06-16 DIAGNOSIS — M461 Sacroiliitis, not elsewhere classified: Secondary | ICD-10-CM | POA: Diagnosis not present

## 2018-06-16 DIAGNOSIS — M5432 Sciatica, left side: Secondary | ICD-10-CM | POA: Insufficient documentation

## 2018-06-16 MED ORDER — KETOROLAC TROMETHAMINE 60 MG/2ML IM SOLN
60.0000 mg | Freq: Once | INTRAMUSCULAR | Status: AC
  Administered 2018-06-16: 60 mg via INTRAMUSCULAR

## 2018-06-16 MED ORDER — MELOXICAM 15 MG PO TABS: 15.0000 mg | ORAL_TABLET | Freq: Every day | ORAL | 0 refills | Status: AC

## 2018-06-16 MED ORDER — METAXALONE 800 MG PO TABS: 800.0000 mg | ORAL_TABLET | Freq: Three times a day (TID) | ORAL | 0 refills | Status: AC

## 2018-06-16 NOTE — ED Triage Notes (Signed)
Patient c/o left thigh pain that started on Thursday.  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  Patient denies injury or fall.

## 2018-06-16 NOTE — Discharge Instructions (Signed)
Apply ice 20 minutes out of every 2 hours 4-5 times daily for comfort.  Avoid symptoms much as possible.

## 2018-06-16 NOTE — ED Provider Notes (Signed)
[facility]    CSN: [id number] Arrival date & time: 06/16/18  1045     History   Chief Complaint Chief Complaint  Patient presents with  . Leg Pain    left    HPI Jay Mccormick is a 48 y.o. male.   HPI  -year-old male presents with left back pain rating into his posterior hip anterior thigh and into his lateral calf noted on Thursday 5 days prior to this visit.  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  Denies any injury or fall.  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  Never any specific time where he injured his back or any increase in activities.  Denies any incontinence.  Exacerbate the pain especially sitting on the toilet seat.          Past Medical History:  Diagnosis Date  . Gallstones     There are no active problems to display for this patient.   History reviewed. No pertinent surgical history.     Home Medications    Prior to Admission medications   Medication Sig Start Date End Date Taking? Authorizing Provider  gabapentin (NEURONTIN) 400 MG capsule Take 1 capsule (400 mg total) by mouth 3 (three) times daily. 06/26/16 06/16/18 Yes Triplett, Cari B, FNP  meloxicam (MOBIC) 15 MG tablet Take 1 tablet (15 mg total) by mouth daily. 06/26/16   Triplett, Rulon Eisenmenger B, FNP  meloxicam (MOBIC) 15 MG tablet Take 1 tablet (15 mg total) by mouth daily. 06/16/18   Lutricia Feil, PA-C  metaxalone (SKELAXIN) 800 MG tablet Take 1 tablet (800 mg total) by mouth 3 (three) times daily. 06/16/18   Lutricia Feil, PA-C    Family History Family History  Problem Relation Age of Onset  . Hypertension Mother   . Hypertension Father   . Diabetes Father     Social History Social History   Tobacco Use  . Smoking status: Never Smoker  . Smokeless tobacco: Never Used  Substance Use Topics  . Alcohol use: Yes  . Drug use: Never     Allergies   Patient has no known allergies.   Review  of Systems Review of Systems  Constitutional: Positive for activity change. Negative for chills, fatigue and fever.  Musculoskeletal: Positive for back pain and myalgias.  All other systems reviewed and are negative.    Physical Exam Triage Vital Signs ED Triage Vitals  Enc Vitals Group     BP 06/16/18 1110 (!) 145/100     Pulse Rate 06/16/18 1110 85     Resp 06/16/18 1110 16     Temp 06/16/18 1110 98.1 F (36.7 C)     Temp Source 06/16/18 1110 Oral     SpO2 06/16/18 1110 99 %     Weight 06/16/18 1107 280 lb (127 kg)     Height 06/16/18 1107 5\' 11"  (1.803 m)     Head Circumference --      Peak Flow --      Pain Score 06/16/18 1106 10     Pain Loc --      Pain Edu? --      Excl. in GC? --    No data found.  Updated Vital Signs BP (!) 145/100 (BP Location: Left Arm)   Pulse 85   Temp 98.1 F (36.7 C) (Oral)   Resp 16   Ht 5\' 11"  (1.803 m)  Wt 280 lb (127 kg)   SpO2 99%   BMI 39.05 kg/m   Visual Acuity Right Eye Distance:   Left Eye Distance:   Bilateral Distance:    Right Eye Near:   Left Eye Near:    Bilateral Near:     Physical Exam Vitals signs and nursing note reviewed.  Constitutional:      General: He is not in acute distress.    Appearance: Normal appearance. He is obese. He is not ill-appearing, toxic-appearing or diaphoretic.  HENT:     Head: Normocephalic.     Nose: Nose normal.     Mouth/Throat:     Mouth: Mucous membranes are moist.     Pharynx: No oropharyngeal exudate or posterior oropharyngeal erythema.  Neck:     Musculoskeletal: Normal range of motion.  Musculoskeletal:        General: Tenderness present.     Comments: Examination of the lumbar spine shows the pelvis to have a list on the right with the right pelvis slightly higher.  Able to forward flex with his hands to the level of his ankles.  Returning to upright posture is uncomfortable.  Maximum tenderness is sharply localized over the left sacroiliac joint.  He is able to toe  and heel walk adequately.  Lower extremity sensation is intact to light touch throughout.  HL peroneal and anterior tibialis are strong to clinical testing.  DTRs are depressed in the ankle but symmetrical.  No external rotation of the hip in the seated position caused him to have pain localized to the sacroiliac area.  Skin:    General: Skin is warm and dry.  Neurological:     General: No focal deficit present.     Mental Status: He is alert and oriented to person, place, and time.  Psychiatric:        Mood and Affect: Mood normal.        Behavior: Behavior normal.        Thought Content: Thought content normal.        Judgment: Judgment normal.      UC Treatments / Results  Labs (all labs ordered are listed, but only abnormal results are displayed) Labs Reviewed - No data to display  EKG None  Radiology No results found.  Procedures Procedures (including critical care time)  Medications Ordered in UC Medications  ketorolac (TORADOL) injection 60 mg (60 mg Intramuscular Given 06/16/18 1154)    Initial Impression / Assessment and Plan / UC Course  I have reviewed the triage vital signs and the nursing notes.  Pertinent labs & imaging results that were available during my care of the patient were reviewed by me and considered in my medical decision making (see chart for details).   Offered the patient a Toradol injection which he accepted.  Explained to him that he has a left sacroiliitis accounting for his sciatic nerve irritation.  Present time all nerves are functional.  Him on Mobic that he will start at dinnertime tonight.  He will take this with food.  Also given him a prescription for Skelaxin.  Has appropriate precautions regarding driving or performing activities requiring concentration or judgment.  Excuse him out of work today and tomorrow.  Him to no lifting greater than 25 pounds for the next 7 days.  Have encouraged him to follow-up with his primary care physician.   Provided information on back injury prevention.   Final Clinical Impressions(s) / UC Diagnoses   Final diagnoses:  Sciatica of left side  Sacroiliitis (HCC)     Discharge Instructions     Apply ice 20 minutes out of every 2 hours 4-5 times daily for comfort.  Avoid symptoms much as possible.    ED Prescriptions    Medication Sig Dispense Auth. Provider   meloxicam (MOBIC) 15 MG tablet Take 1 tablet (15 mg total) by mouth daily. 30 tablet Lutricia Feil, PA-C   metaxalone (SKELAXIN) 800 MG tablet Take 1 tablet (800 mg total) by mouth 3 (three) times daily. 21 tablet Lutricia Feil, PA-C     Controlled Substance Prescriptions [place] Controlled Substance Registry consulted? Not Applicable   Lutricia Feil, PA-C 06/16/18 1209

## 2019-08-20 ENCOUNTER — Ambulatory Visit: Payer: Self-pay | Attending: Internal Medicine

## 2019-08-20 DIAGNOSIS — Z23 Encounter for immunization: Secondary | ICD-10-CM

## 2019-08-20 NOTE — Progress Notes (Signed)
   Covid-19 Vaccination Clinic  Name:  Carlous Olivares    MRN: [id number] DOB: 1970/06/06  08/20/2019  Mr. Breach was observed post Covid-19 immunization for 15 minutes without incident. He was provided with Vaccine Information Sheet and instruction to access the V-Safe system.   Mr. Minkin was instructed to call 911 with any severe reactions post vaccine: Marland Kitchen Difficulty breathing  . Swelling of face and throat  . A fast heartbeat  . A bad rash all over body  . Dizziness and weakness   Immunizations Administered    Name Date Dose VIS Date Route   Pfizer COVID-19 Vaccine 08/20/2019  1:16 PM 0.3 mL 05/14/2019 Intramuscular   Manufacturer: [employer], [employer]   Lot: [id number]   NDC: [address]-8

## 2019-09-10 ENCOUNTER — Ambulatory Visit: Payer: Self-pay | Attending: Internal Medicine

## 2019-09-10 DIAGNOSIS — Z23 Encounter for immunization: Secondary | ICD-10-CM

## 2019-09-10 NOTE — Progress Notes (Signed)
   Covid-19 Vaccination Clinic  Name:  Jay Mccormick    MRN: [id number] DOB: September 10, 1970  09/10/2019  Jay Mccormick was observed post Covid-19 immunization for 15 minutes without incident. He was provided with Vaccine Information Sheet and instruction to access the V-Safe system.   Jay Mccormick was instructed to call 911 with any severe reactions post vaccine: Marland Kitchen Difficulty breathing  . Swelling of face and throat  . A fast heartbeat  . A bad rash all over body  . Dizziness and weakness   Immunizations Administered    Name Date Dose VIS Date Route   Pfizer COVID-19 Vaccine 09/10/2019 12:28 PM 0.3 mL 05/14/2019 Intramuscular   Manufacturer: [employer], [employer]   Lot: [id number]   NDC: [address]-6

## 2020-03-09 ENCOUNTER — Emergency Department
Admission: EM | Admit: 2020-03-09 | Discharge: 2020-03-09 | Disposition: A | Discharge status: Home / Self Care | Payer: PRIVATE HEALTH INSURANCE | Attending: Emergency Medicine | Admitting: Emergency Medicine

## 2020-03-09 ENCOUNTER — Other Ambulatory Visit: Payer: Self-pay

## 2020-03-09 ENCOUNTER — Encounter: Payer: Self-pay | Admitting: Emergency Medicine

## 2020-03-09 DIAGNOSIS — R030 Elevated blood-pressure reading, without diagnosis of hypertension: Secondary | ICD-10-CM | POA: Insufficient documentation

## 2020-03-09 DIAGNOSIS — H1032 Unspecified acute conjunctivitis, left eye: Secondary | ICD-10-CM | POA: Insufficient documentation

## 2020-03-09 DIAGNOSIS — H5712 Ocular pain, left eye: Secondary | ICD-10-CM | POA: Diagnosis present

## 2020-03-09 MED ORDER — EYE WASH OPHTH SOLN
1.0000 [drp] | OPHTHALMIC | Status: DC | PRN
  Filled 2020-03-09: qty 118

## 2020-03-09 MED ORDER — AMLODIPINE BESYLATE 5 MG PO TABS: 5.0000 mg | ORAL_TABLET | Freq: Every day | ORAL | 11 refills | Status: AC

## 2020-03-09 MED ORDER — TETRACAINE HCL 0.5 % OP SOLN
2.0000 [drp] | Freq: Once | OPHTHALMIC | Status: AC
  Administered 2020-03-09: 2 [drp] via OPHTHALMIC
  Filled 2020-03-09: qty 4

## 2020-03-09 MED ORDER — TOBRAMYCIN 0.3 % OP SOLN: 2.0000 [drp] | OPHTHALMIC | 0 refills | Status: AC

## 2020-03-09 MED ORDER — PREDNISOLONE ACETATE 1 % OP SUSP: 1.0000 [drp] | Freq: Four times a day (QID) | OPHTHALMIC | 0 refills | Status: AC

## 2020-03-09 MED ORDER — FLUORESCEIN SODIUM 1 MG OP STRP
1.0000 | ORAL_STRIP | Freq: Once | OPHTHALMIC | Status: AC
  Administered 2020-03-09: 1 via OPHTHALMIC
  Filled 2020-03-09: qty 1

## 2020-03-09 NOTE — ED Provider Notes (Signed)
[facility] Emergency Department Provider Note  ____________________________________________   First MD Initiated Contact with Patient 03/09/20 1207     (approximate)  I have reviewed the triage vital signs and the nursing notes.   HISTORY  Chief Complaint Eye Pain    HPI Jay Mccormick is a 49 y.o. male presents emergency department complaining of left eye redness and irritation.  Patient states it started yesterday.  No history of glaucoma.  No change in vision.  No known injury.  Visual acuity is 20/25 in the left and 20/13 on the right.    Past Medical History:  Diagnosis Date  . Gallstones     There are no problems to display for this patient.   History reviewed. No pertinent surgical history.  Prior to Admission medications   Medication Sig Start Date End Date Taking? Authorizing Provider  amLODipine (NORVASC) 5 MG tablet Take 1 tablet (5 mg total) by mouth daily. 03/09/20 03/09/21  Alyviah Crandle, Roselyn Bering, PA-C  gabapentin (NEURONTIN) 400 MG capsule Take 1 capsule (400 mg total) by mouth 3 (three) times daily. 06/26/16 06/16/18  Triplett, Rulon Eisenmenger B, FNP  meloxicam (MOBIC) 15 MG tablet Take 1 tablet (15 mg total) by mouth daily. 06/26/16   Triplett, Rulon Eisenmenger B, FNP  meloxicam (MOBIC) 15 MG tablet Take 1 tablet (15 mg total) by mouth daily. 06/16/18   Lutricia Feil, PA-C  metaxalone (SKELAXIN) 800 MG tablet Take 1 tablet (800 mg total) by mouth 3 (three) times daily. 06/16/18   Lutricia Feil, PA-C  prednisoLONE acetate (PRED FORTE) 1 % ophthalmic suspension Place 1 drop into the left eye 4 (four) times daily. For 5 days 03/09/20   Sherrie Mustache Roselyn Bering, PA-C  tobramycin (TOBREX) 0.3 % ophthalmic solution Place 2 drops into the left eye every 4 (four) hours. 03/09/20   Faythe Ghee, PA-C    Allergies Patient has no known allergies.  Family History  Problem Relation Age of Onset  . Hypertension Mother   . Hypertension Father   . Diabetes Father     Social  History Social History   Tobacco Use  . Smoking status: Never Smoker  . Smokeless tobacco: Never Used  Vaping Use  . Vaping Use: Never used  Substance Use Topics  . Alcohol use: Yes  . Drug use: Never    Review of Systems  Constitutional: No fever/chills Eyes: No visual changes.  Positive for left eye redness ENT: No sore throat. Respiratory: Denies cough Cardiovascular: Denies chest pain Gastrointestinal: Denies abdominal pain Genitourinary: Negative for dysuria. Musculoskeletal: Negative for back pain. Skin: Negative for rash. Psychiatric: no mood changes,     ____________________________________________   PHYSICAL EXAM:  VITAL SIGNS: ED Triage Vitals  Enc Vitals Group     BP 03/09/20 1102 (!) 157/106     Pulse Rate 03/09/20 1102 (!) 113     Resp 03/09/20 1102 16     Temp 03/09/20 1102 98.9 F (37.2 C)     Temp Source 03/09/20 1102 Oral     SpO2 03/09/20 1102 96 %     Weight 03/09/20 1007 280 lb (127 kg)     Height 03/09/20 1007 5\' 11"  (1.803 m)     Head Circumference --      Peak Flow --      Pain Score 03/09/20 1006 2     Pain Loc --      Pain Edu? --      Excl. in GC? --  Constitutional: Alert and oriented. Well appearing and in no acute distress. Eyes: Conjunctivae injected in the medial aspect of the left eye Head: Atraumatic. Nose: No congestion/rhinnorhea. Mouth/Throat: Mucous membranes are moist.   Neck:  supple no lymphadenopathy noted Cardiovascular: Normal rate, regular rhythm. Heart sounds are normal Respiratory: Normal respiratory effort.  No retractions, lungs c t a  GU: deferred Musculoskeletal: FROM all extremities, warm and well perfused Neurologic:  Normal speech and language.  Skin:  Skin is warm, dry and intact. No rash noted. Psychiatric: Mood and affect are normal. Speech and behavior are normal.  ____________________________________________   LABS (all labs ordered are listed, but only abnormal results are  displayed)  Labs Reviewed - No data to display ____________________________________________   ____________________________________________  RADIOLOGY    ____________________________________________   PROCEDURES  Procedure(s) performed: No  Procedures    ____________________________________________   INITIAL IMPRESSION / ASSESSMENT AND PLAN / ED COURSE  Pertinent labs & imaging results that were available during my care of the patient were reviewed by me and considered in my medical decision making (see chart for details).   Patient is 49 year old male presents emergency department left eye irritation.  See HPI.  Physical exam is consistent with a conjunctivitis.  Fluorescein stain did not show any abrasions.   Explained findings to the patient.  He was given tobramycin and prednisolone ophthalmic drops.  Follow-up with ophthalmology.  At discharge his blood pressure was elevated and so we did start him on some amlodipine.  Follow-up with regular doctor for evaluation of hypertension.  Discharged stable condition  Jay Mccormick was evaluated in Emergency Department on 03/09/2020 for the symptoms described in the history of present illness. He was evaluated in the context of the global COVID-19 pandemic, which necessitated consideration that the patient might be at risk for infection with the SARS-CoV-2 virus that causes COVID-19. Institutional protocols and algorithms that pertain to the evaluation of patients at risk for COVID-19 are in a state of rapid change based on information released by regulatory bodies including the CDC and federal and state organizations. These policies and algorithms were followed during the patient's care in the ED.    As part of my medical decision making, I reviewed the following data within the electronic MEDICAL RECORD NUMBER Nursing notes reviewed and incorporated, Old chart reviewed, Notes from prior ED visits and [place] Controlled Substance  Database  ____________________________________________   FINAL CLINICAL IMPRESSION(S) / ED DIAGNOSES  Final diagnoses:  Acute conjunctivitis of left eye, unspecified acute conjunctivitis type  Elevated blood pressure reading      NEW MEDICATIONS STARTED DURING THIS VISIT:  New Prescriptions   AMLODIPINE (NORVASC) 5 MG TABLET    Take 1 tablet (5 mg total) by mouth daily.   PREDNISOLONE ACETATE (PRED FORTE) 1 % OPHTHALMIC SUSPENSION    Place 1 drop into the left eye 4 (four) times daily. For 5 days   TOBRAMYCIN (TOBREX) 0.3 % OPHTHALMIC SOLUTION    Place 2 drops into the left eye every 4 (four) hours.     Note:  This document was prepared using Dragon voice recognition software and may include unintentional dictation errors.    Faythe Ghee, PA-C 03/09/20 1756    Jene Every, MD 03/10/20 [phone]

## 2020-03-09 NOTE — ED Notes (Signed)
Checked BP again at R wrist. Remained elevated. Provider notified. Pt denies use of BP meds at home.

## 2020-03-09 NOTE — ED Notes (Signed)
Pt in with swollen, irritated L eye; red at medial aspect. Pt states it started to get this way yesterday and didn't get better. Pt unsure of any specific injury; states it feels like something is in his eye. Nothing obviously visible in eye. Denies major pain to eye; states just mildly uncomfortable.

## 2020-03-09 NOTE — ED Triage Notes (Signed)
Pt reports Saturday started with irritation to his left eye and yesterday it started hurting. Pt left eye red. Denies injuries

## 2020-03-09 NOTE — ED Notes (Signed)
Visual acuity: L (injured eye) 20/25 at best; R eye 20/13 at best.

## 2020-03-09 NOTE — Discharge Instructions (Addendum)
Follow-up with [facility].  Please use medicines as prescribed.  Return if worsening

## 2021-03-06 ENCOUNTER — Other Ambulatory Visit
Admission: RE | Admit: 2021-03-06 | Discharge: 2021-03-06 | Disposition: A | Discharge status: Home / Self Care | Payer: 59 | Source: Home / Self Care | Attending: Ophthalmology | Admitting: Ophthalmology

## 2021-03-06 ENCOUNTER — Ambulatory Visit
Admission: RE | Admit: 2021-03-06 | Discharge: 2021-03-06 | Disposition: A | Discharge status: Home / Self Care | Payer: 59 | Source: Ambulatory Visit | Attending: Ophthalmology | Admitting: Ophthalmology

## 2021-03-06 ENCOUNTER — Ambulatory Visit
Admission: RE | Admit: 2021-03-06 | Discharge: 2021-03-06 | Disposition: A | Discharge status: Home / Self Care | Payer: 59 | Attending: Ophthalmology | Admitting: Ophthalmology

## 2021-03-06 ENCOUNTER — Other Ambulatory Visit: Payer: Self-pay | Admitting: Ophthalmology

## 2021-03-06 DIAGNOSIS — R0602 Shortness of breath: Secondary | ICD-10-CM

## 2021-03-06 LAB — CBC
HCT: 39.9 % (ref 39.0–52.0)
Hemoglobin: 13 g/dL (ref 13.0–17.0)
MCH: 25.5 pg — ABNORMAL LOW (ref 26.0–34.0)
MCHC: 32.6 g/dL (ref 30.0–36.0)
MCV: 78.2 fL — ABNORMAL LOW (ref 80.0–100.0)
Platelets: 537 10*3/uL — ABNORMAL HIGH (ref 150–400)
RBC: 5.1 MIL/uL (ref 4.22–5.81)
RDW: 16.3 % — ABNORMAL HIGH (ref 11.5–15.5)
WBC: 11.1 10*3/uL — ABNORMAL HIGH (ref 4.0–10.5)
nRBC: 0 % (ref 0.0–0.2)

## 2021-03-06 LAB — SEDIMENTATION RATE: Sed Rate: 33 mm/hr — ABNORMAL HIGH (ref 0–15)

## 2021-03-07 LAB — ANCA TITERS
Atypical P-ANCA titer: <1:20 {titer}
C-ANCA: <1:20 {titer}
P-ANCA: <1:20 {titer}

## 2021-03-07 LAB — RPR: RPR Ser Ql: NONREACTIVE

## 2021-03-07 LAB — ANA: Anti Nuclear Antibody (ANA): NEGATIVE

## 2021-03-07 LAB — ANGIOTENSIN CONVERTING ENZYME: Angiotensin-Converting Enzyme: 39 U/L (ref 14–82)

## 2021-03-08 LAB — RHEUMATOID FACTOR: Rhuematoid fact SerPl-aCnc: <10 IU/mL (ref <14.0)

## 2021-03-09 LAB — QUANTIFERON-TB GOLD PLUS: QuantiFERON-TB Gold Plus: NEGATIVE

## 2021-03-09 LAB — QUANTIFERON-TB GOLD PLUS (RQFGPL)
QuantiFERON Mitogen Value: 9.72 IU/mL
QuantiFERON Nil Value: 0.05 IU/mL
QuantiFERON TB1 Ag Value: 0.05 IU/mL
QuantiFERON TB2 Ag Value: 0.04 IU/mL

## 2021-03-13 LAB — HLA-B27 ANTIGEN: HLA-B27: NEGATIVE

## 2022-04-06 IMAGING — CR DG CHEST 2V
1 series · 2 of 2 positions shown · non-contrast
Comparison: None.

CLINICAL DATA: Shortness of breath.

EXAM:
CHEST - 2 VIEW

[Series 1: dg chest 2 view · 0.14mm/px · 2 of 2 slices shown]
[im 1/2]
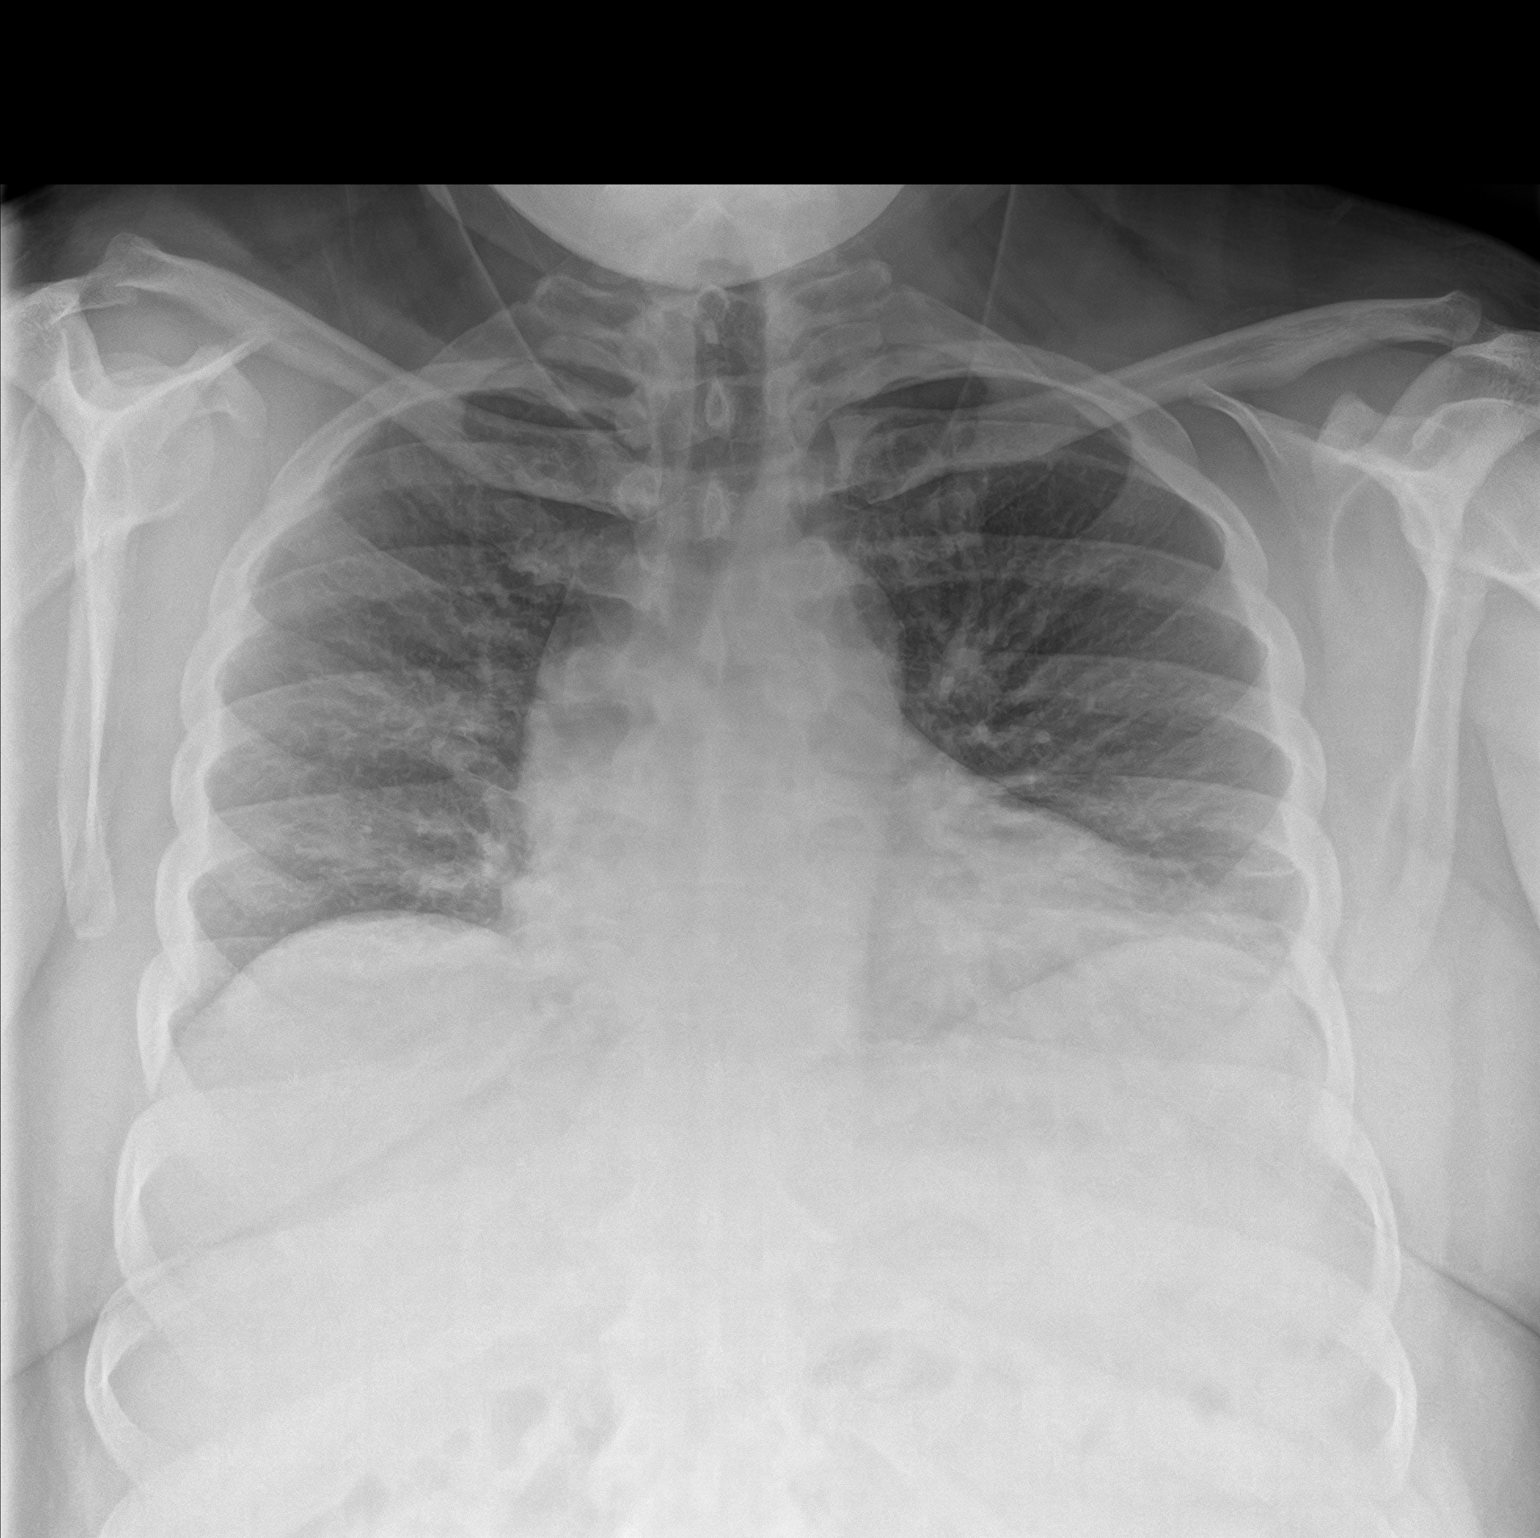
[im 2/2]
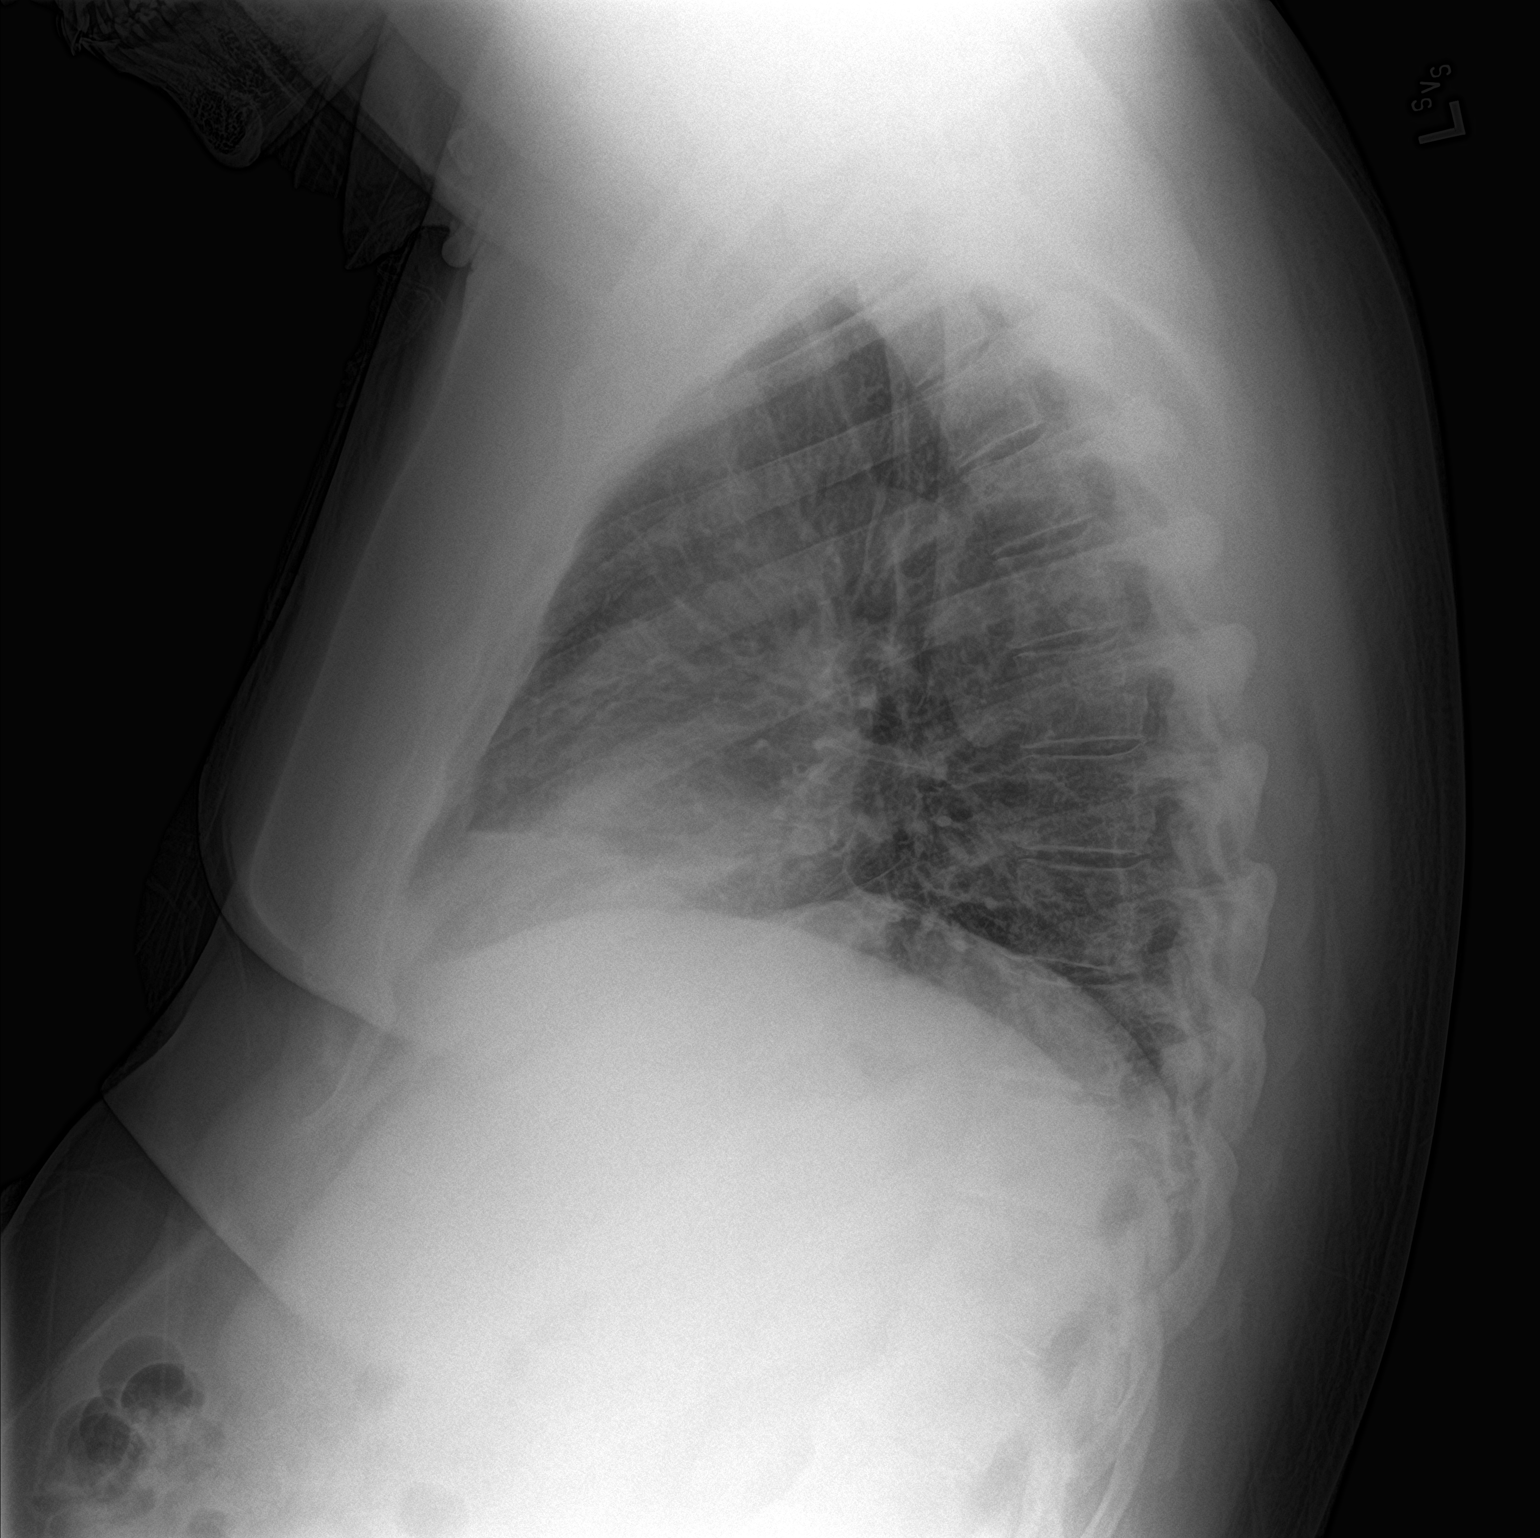

[2 of 2 positions shown; findings below may reference images not displayed]

FINDINGS: The heart size and mediastinal contours are within normal limits.
Hypoinflation of the lungs is noted with minimal bibasilar
subsegmental atelectasis. The visualized skeletal structures are
unremarkable.
IMPRESSION: Hypoinflation of the lungs with minimal bibasilar subsegmental
atelectasis.

## 2023-09-02 ENCOUNTER — Encounter: Payer: Self-pay | Admitting: Intensive Care

## 2023-09-02 ENCOUNTER — Emergency Department: Payer: Self-pay

## 2023-09-02 ENCOUNTER — Emergency Department
Admission: EM | Admit: 2023-09-02 | Discharge: 2023-09-02 | Disposition: A | Discharge status: Home / Self Care | Payer: Self-pay | Attending: Emergency Medicine | Admitting: Emergency Medicine

## 2023-09-02 ENCOUNTER — Other Ambulatory Visit: Payer: Self-pay

## 2023-09-02 DIAGNOSIS — G4730 Sleep apnea, unspecified: Secondary | ICD-10-CM | POA: Diagnosis not present

## 2023-09-02 DIAGNOSIS — R0602 Shortness of breath: Secondary | ICD-10-CM | POA: Insufficient documentation

## 2023-09-02 DIAGNOSIS — E876 Hypokalemia: Secondary | ICD-10-CM | POA: Diagnosis not present

## 2023-09-02 HISTORY — DX: Essential (primary) hypertension: I10

## 2023-09-02 HISTORY — DX: Gastro-esophageal reflux disease without esophagitis: K21.9

## 2023-09-02 LAB — CBC
HCT: 40 % (ref 39.0–52.0)
Hemoglobin: 13.5 g/dL (ref 13.0–17.0)
MCH: 26.3 pg (ref 26.0–34.0)
MCHC: 33.8 g/dL (ref 30.0–36.0)
MCV: 77.8 fL — ABNORMAL LOW (ref 80.0–100.0)
Platelets: 522 10*3/uL — ABNORMAL HIGH (ref 150–400)
RBC: 5.14 MIL/uL (ref 4.22–5.81)
RDW: 15.9 % — ABNORMAL HIGH (ref 11.5–15.5)
WBC: 11.5 10*3/uL — ABNORMAL HIGH (ref 4.0–10.5)
nRBC: 0 % (ref 0.0–0.2)

## 2023-09-02 LAB — BASIC METABOLIC PANEL WITH GFR
Anion gap: 12 (ref 5–15)
BUN: 19 mg/dL (ref 6–20)
CO2: 29 mmol/L (ref 22–32)
Calcium: 9.4 mg/dL (ref 8.9–10.3)
Chloride: 96 mmol/L — ABNORMAL LOW (ref 98–111)
Creatinine, Ser: 1.42 mg/dL — ABNORMAL HIGH (ref 0.61–1.24)
GFR, Estimated: 60 mL/min — ABNORMAL LOW (ref >60)
Glucose, Bld: 151 mg/dL — ABNORMAL HIGH (ref 70–99)
Potassium: 2.1 mmol/L — CL (ref 3.5–5.1)
Sodium: 137 mmol/L (ref 135–145)

## 2023-09-02 LAB — TROPONIN I (HIGH SENSITIVITY)
Troponin I (High Sensitivity): 6 ng/L (ref <18)
Troponin I (High Sensitivity): 6 ng/L (ref <18)

## 2023-09-02 LAB — MAGNESIUM: Magnesium: 2.3 mg/dL (ref 1.7–2.4)

## 2023-09-02 MED ORDER — POTASSIUM CHLORIDE 10 MEQ/100ML IV SOLN
10.0000 meq | INTRAVENOUS | Status: AC
  Administered 2023-09-02 (×3): 10 meq via INTRAVENOUS
  Filled 2023-09-02 (×3): qty 100

## 2023-09-02 MED ORDER — POTASSIUM CHLORIDE CRYS ER 20 MEQ PO TBCR
40.0000 meq | EXTENDED_RELEASE_TABLET | Freq: Once | ORAL | Status: AC
  Administered 2023-09-02: 40 meq via ORAL
  Filled 2023-09-02: qty 2

## 2023-09-02 NOTE — Discharge Instructions (Addendum)
 As we discussed, would recommend you talk to your doctor about getting a sleep study done for sleep apnea.  Follow-up with your doctor for recheck of your potassium level after all the potassium we gave you today.  Return to the ED with any worsening symptoms

## 2023-09-02 NOTE — ED Triage Notes (Addendum)
 Patient c/o sob and intermittent chest pain X1-2 months. Reports worse when lying flat.   Denies pain

## 2023-09-02 NOTE — ED Provider Notes (Signed)
 [facility] Provider Note    Event Date/Time   First MD Initiated Contact with Patient 09/02/23 1206     (approximate)   History   Shortness of Breath   HPI  Jay Mccormick is a 53 y.o. male who presents to the ED for evaluation of Shortness of Breath   Patient presents to the ED for evaluation of 2 episodes of chest discomfort in the right side over the past week or 2.  No pain right now.  Symptoms lasting minutes-hours at a time before self resolving without coexisting symptoms such as nausea, dizziness or syncope, trauma, cough.  Does report poor sleep at night and sometimes feeling worse when he wakes up in the morning.  He has been told that he needs to do a sleep study but has never done this due to fear of using a CPAP machine.  Denies any shortness of breath or respiratory symptoms alongside his chest discomfort.   Physical Exam   Triage Vital Signs: ED Triage Vitals  Encounter Vitals Group     BP 09/02/23 1042 121/77     Systolic BP Percentile --      Diastolic BP Percentile --      Pulse Rate 09/02/23 1042 98     Resp 09/02/23 1042 16     Temp 09/02/23 1042 98.8 F (37.1 C)     Temp Source 09/02/23 1042 Oral     SpO2 09/02/23 1042 99 %     Weight --      Height 09/02/23 1036 5\' 9"  (1.753 m)     Head Circumference --      Peak Flow --      Pain Score 09/02/23 1036 0     Pain Loc --      Pain Education --      Exclude from Growth Chart --     Most recent vital signs: Vitals:   09/02/23 1330 09/02/23 1500  BP: 106/85 110/76  Pulse: 83 85  Resp: 13 13  Temp:    SpO2: 97% 97%    General: Awake, no distress.  CV:  Good peripheral perfusion.  RRR without appreciable murmur Resp:  Normal effort.  Clear Abd:  No distention.  MSK:  No deformity noted.  Neuro:  No focal deficits appreciated. Other:     ED Results / Procedures / Treatments   Labs (all labs ordered are listed, but only abnormal results are displayed) Labs  Reviewed  BASIC METABOLIC PANEL WITH GFR - Abnormal; Notable for the following components:      Result Value   Potassium 2.1 (*)    Chloride 96 (*)    Glucose, Bld 151 (*)    Creatinine, Ser 1.42 (*)    GFR, Estimated 60 (*)    All other components within normal limits  CBC - Abnormal; Notable for the following components:   WBC 11.5 (*)    MCV 77.8 (*)    RDW 15.9 (*)    Platelets 522 (*)    All other components within normal limits  MAGNESIUM  TROPONIN I (HIGH SENSITIVITY)  TROPONIN I (HIGH SENSITIVITY)    EKG Sinus rhythm with a rate of 92 bpm.  Normal axis and intervals.  No clear signs of acute ischemia.  Nonspecific changes.  RADIOLOGY CXR interpreted by me without evidence of acute cardiopulmonary pathology.  Official radiology report(s): DG Chest 2 View Result Date: 09/02/2023 CLINICAL DATA:  Chest pain and shortness of breath. EXAM: CHEST -  2 VIEW COMPARISON:  Chest radiograph dated 03/06/2021. FINDINGS: The heart size and mediastinal contours are within normal limits. Mild left basilar linear atelectasis. No focal consolidation, pleural effusion, or pneumothorax. No acute osseous abnormality. IMPRESSION: Mild left basilar atelectasis. Otherwise, no acute cardiopulmonary findings. Electronically Signed   By: Hart Robinsons M.D.   On: 09/02/2023 12:55    PROCEDURES and INTERVENTIONS:  .1-3 Lead EKG Interpretation  Performed by: Delton Prairie, MD Authorized by: Delton Prairie, MD     Interpretation: normal     ECG rate:  80   ECG rate assessment: normal     Rhythm: sinus rhythm     Ectopy: none     Conduction: normal   .Critical Care  Performed by: Delton Prairie, MD Authorized by: Delton Prairie, MD   Critical care provider statement:    Critical care time (minutes):  30   Critical care time was exclusive of:  Separately billable procedures and treating other patients   Critical care was necessary to treat or prevent imminent or life-threatening deterioration of  the following conditions:  Metabolic crisis   Critical care was time spent personally by me on the following activities:  Development of treatment plan with patient or surrogate, discussions with consultants, evaluation of patient's response to treatment, examination of patient, ordering and review of laboratory studies, ordering and review of radiographic studies, ordering and performing treatments and interventions, pulse oximetry, re-evaluation of patient's condition and review of old charts   Medications  potassium chloride 10 mEq in 100 mL IVPB (10 mEq Intravenous New Bag/Given 09/02/23 1446)  potassium chloride SA (KLOR-CON M) CR tablet 40 mEq (40 mEq Oral Given 09/02/23 1227)  potassium chloride SA (KLOR-CON M) CR tablet 40 mEq (40 mEq Oral Given 09/02/23 1319)     IMPRESSION / MDM / ASSESSMENT AND PLAN / ED COURSE  I reviewed the triage vital signs and the nursing notes.  Differential diagnosis includes, but is not limited to, ACS, PTX, PNA, muscle strain/spasm, PE, dissection, anxiety, pleural effusion  {Patient presents with symptoms of an acute illness or injury that is potentially life-threatening.  Patient presents with atypical chest discomfort likely related to hypokalemia.  Very low potassium at 2.1 replaced orally and IV.  Normal magnesium level, troponins.  Reassuring CBC and clear CXR.  I suspect OSA and recommend the patient get a sleep study and we discussed his hesitations to do this.  I considered observation admission for this patient.  Ultimately I think is suitable for outpatient management and PCP follow-up.      FINAL CLINICAL IMPRESSION(S) / ED DIAGNOSES   Final diagnoses:  Hypokalemia  Sleep apnea, unspecified type     Rx / DC Orders   ED Discharge Orders     None        Note:  This document was prepared using Dragon voice recognition software and may include unintentional dictation errors.   Delton Prairie, MD 09/02/23 [phone]

## 2023-09-02 NOTE — ED Notes (Signed)
Pt d/c home per EDP order. Discharge summary reviewed, pt verbalizes understanding. NAD.
